# Patient Record
Sex: Female | Born: 1995 | Race: Black or African American | Hispanic: No | Marital: Single | Smoking: Former smoker
Health system: Southern US, Community
[De-identification: ages and names within clinical notes are randomized; demographics above are authoritative.]

## PROBLEM LIST (undated history)

## (undated) ENCOUNTER — Inpatient Hospital Stay: Payer: Self-pay

## (undated) DIAGNOSIS — J3089 Other allergic rhinitis: Secondary | ICD-10-CM

---
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
## 2016-04-15 ENCOUNTER — Ambulatory Visit
Admission: EM | Admit: 2016-04-15 | Discharge: 2016-04-15 | Disposition: A | Discharge status: Home / Self Care | Payer: Medicaid Other | Attending: Family Medicine | Admitting: Family Medicine

## 2016-04-15 DIAGNOSIS — Z7689 Persons encountering health services in other specified circumstances: Secondary | ICD-10-CM | POA: Diagnosis not present

## 2016-04-15 HISTORY — DX: Other allergic rhinitis: J30.89
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
## 2016-12-26 ENCOUNTER — Encounter: Payer: Self-pay | Admitting: Emergency Medicine

## 2016-12-26 ENCOUNTER — Emergency Department
Admission: EM | Admit: 2016-12-26 | Discharge: 2016-12-26 | Disposition: A | Discharge status: Home / Self Care | Payer: Medicaid Other | Attending: Emergency Medicine | Admitting: Emergency Medicine

## 2016-12-26 ENCOUNTER — Emergency Department: Payer: Medicaid Other

## 2016-12-26 DIAGNOSIS — R102 Pelvic and perineal pain: Secondary | ICD-10-CM | POA: Diagnosis not present

## 2016-12-26 DIAGNOSIS — F17210 Nicotine dependence, cigarettes, uncomplicated: Secondary | ICD-10-CM | POA: Diagnosis not present

## 2016-12-26 DIAGNOSIS — Z79899 Other long term (current) drug therapy: Secondary | ICD-10-CM | POA: Insufficient documentation

## 2016-12-26 DIAGNOSIS — N83201 Unspecified ovarian cyst, right side: Secondary | ICD-10-CM | POA: Diagnosis not present

## 2016-12-26 DIAGNOSIS — R109 Unspecified abdominal pain: Secondary | ICD-10-CM | POA: Diagnosis present

## 2016-12-26 LAB — URINALYSIS, COMPLETE (UACMP) WITH MICROSCOPIC
BILIRUBIN URINE: NEGATIVE
Bacteria, UA: NONE SEEN
Glucose, UA: NEGATIVE mg/dL
HGB URINE DIPSTICK: NEGATIVE
Ketones, ur: NEGATIVE mg/dL
Nitrite: NEGATIVE
PH: 6 (ref 5.0–8.0)
Protein, ur: 30 mg/dL — AB
RBC / HPF: NONE SEEN RBC/hpf (ref 0–5)
Specific Gravity, Urine: 1.015 (ref 1.005–1.030)

## 2016-12-26 LAB — CHLAMYDIA/NGC RT PCR (ARMC ONLY)
CHLAMYDIA TR: DETECTED — AB
N gonorrhoeae: NOT DETECTED

## 2016-12-26 LAB — WET PREP, GENITAL
CLUE CELLS WET PREP: NONE SEEN
Sperm: NONE SEEN
TRICH WET PREP: NONE SEEN
Yeast Wet Prep HPF POC: NONE SEEN

## 2016-12-26 LAB — POCT PREGNANCY, URINE: Preg Test, Ur: NEGATIVE

## 2016-12-26 MED ORDER — AZITHROMYCIN 500 MG PO TABS: 1000.0000 mg | ORAL_TABLET | Freq: Once | ORAL | Status: DC

## 2016-12-26 MED ORDER — ONDANSETRON 4 MG PO TBDP: 4.0000 mg | ORAL_TABLET | Freq: Once | ORAL | Status: DC

## 2016-12-26 MED ORDER — CEFTRIAXONE SODIUM 250 MG IJ SOLR: 250.0000 mg | Freq: Once | INTRAMUSCULAR | Status: DC

## 2016-12-26 NOTE — ED Triage Notes (Signed)
Pt presents with abd pain for two weeks.

## 2016-12-26 NOTE — ED Notes (Signed)
Patient here for lower abdominal pain x 2 weeks. Patient texting during assessment. Denies V/D. C/O nausea.

## 2016-12-26 NOTE — ED Notes (Signed)
Patient transported to US 

## 2016-12-26 NOTE — ED Provider Notes (Addendum)
[facility] Emergency Department Provider Note  ____________________________________________   First MD Initiated Contact with Patient 12/26/16 [phone]     (approximate)  I have reviewed the triage vital signs and the nursing notes.   HISTORY  Chief Complaint Abdominal Pain    HPI Molly Schmidt is a 21 y.o. female who self presents to the emergency department with 2 weeks of lower abdominal and pelvic pain. Pain is aching cramping and "uncomfortable". She has not had pain every day. Her last menstrual period was roughly 1 month ago. She has had sex since the pain started and it does not make it any better or worse. She's had no fevers or chills. No flank pain. No history of abdominal surgeries. She denies vaginal discharge or foul odor. The pain seems to be somewhat worse postprandially but nothing in particular makes it better. It is not twisting cramping aching or colicky.   [phone] Pink   Past Medical History:  Diagnosis Date  . Environmental and seasonal allergies     There are no active problems to display for this patient.   History reviewed. No pertinent surgical history.  Prior to Admission medications   Medication Sig Start Date End Date Taking? Authorizing Provider  etonogestrel (NEXPLANON) 68 MG IMPL implant 1 each by Subdermal route once.   Yes [provider]  ibuprofen (ADVIL,MOTRIN) 600 MG tablet Take 1 tablet (600 mg total) by mouth every 8 (eight) hours as needed. Patient not taking: Reported on 12/26/2016 10/14/15   Joni Reining, PA-C  ondansetron (ZOFRAN ODT) 4 MG disintegrating tablet Take 1 tablet (4 mg total) by mouth every 6 (six) hours as needed for nausea or vomiting. Patient not taking: Reported on 12/26/2016 10/05/15   Menshew, Charlesetta Ivory, PA-C  ranitidine (ZANTAC) 150 MG capsule Take 1 capsule (150 mg total) by mouth 2 (two) times daily. Patient not taking: Reported on 12/26/2016 03/20/15   Sharman Cheek,  MD  sucralfate (CARAFATE) 1 G tablet Take 1 tablet (1 g total) by mouth 4 (four) times daily. Patient not taking: Reported on 12/26/2016 03/20/15   Sharman Cheek, MD  traMADol (ULTRAM) 50 MG tablet Take 1 tablet (50 mg total) by mouth every 6 (six) hours as needed for moderate pain. Patient not taking: Reported on 12/26/2016 10/14/15   Joni Reining, PA-C    Allergies Patient has no known allergies.  No family history on file.  Social History Social History  Substance Use Topics  . Smoking status: Current Every Day Smoker    Packs/day: 0.50    Types: Cigarettes  . Smokeless tobacco: Never Used  . Alcohol use No    Review of Systems Constitutional: No fever/chills Eyes: No visual changes. ENT: No sore throat. Cardiovascular: Denies chest pain. Respiratory: Denies shortness of breath. Gastrointestinal: Positive abdominal pain.  No nausea, no vomiting.  No diarrhea.  No constipation. Genitourinary: Negative for dysuria. Musculoskeletal: Negative for back pain. Skin: Negative for rash. Neurological: Negative for headaches, focal weakness or numbness.   ____________________________________________   PHYSICAL EXAM:  VITAL SIGNS: ED Triage Vitals  Enc Vitals Group     BP 12/26/16 0902 120/78     Pulse Rate 12/26/16 0902 (!) 121     Resp 12/26/16 0902 12     Temp 12/26/16 0902 99.2 F (37.3 C)     Temp Source 12/26/16 0902 Oral     SpO2 12/26/16 0902 98 %     Weight 12/26/16 0900 120 lb (54.4  kg)     Height 12/26/16 0902 5\' 5"  (1.651 m)     Head Circumference --      Peak Flow --      Pain Score 12/26/16 0922 6     Pain Loc --      Pain Edu? --      Excl. in GC? --     Constitutional: Alert and oriented 4 pleasant cooperative speaks in full clear sentences no diaphoresis Eyes: PERRL EOMI. Head: Atraumatic. Nose: No congestion/rhinnorhea. Mouth/Throat: No trismus Neck: No stridor.   Cardiovascular: Normal rate, regular rhythm. Grossly normal heart sounds.   Good peripheral circulation. Respiratory: Normal respiratory effort.  No retractions. Lungs CTAB and moving good air Gastrointestinal: Soft nondistended mild lower abdominal tenderness although no McBurney's tenderness negative Rovsing's no costovertebral tenderness Genitourinary exam: Chaperoned by female nurse,: Normal external exam copious amount of mucopurulent discharge coming from the cervical os with cervical motion tenderness and uterine tenderness Musculoskeletal: No lower extremity edema   Neurologic:  Normal speech and language. No gross focal neurologic deficits are appreciated. Skin:  Skin is warm, dry and intact. No rash noted. Psychiatric: Mood and affect are normal. Speech and behavior are normal.    ____________________________________________   DIFFERENTIAL includes but not limited to  Appendicitis, diverticulitis, pelvic inflammatory disease, gonorrhea, chlamydia, cervicitis, ovarian torsion ____________________________________________   LABS (all labs ordered are listed, but only abnormal results are displayed)  Labs Reviewed  WET PREP, GENITAL - Abnormal; Notable for the following:       Result Value   WBC, Wet Prep HPF POC MANY (*)    All other components within normal limits  URINALYSIS, COMPLETE (UACMP) WITH MICROSCOPIC - Abnormal; Notable for the following:    Color, Urine YELLOW (*)    APPearance CLOUDY (*)    Protein, ur 30 (*)    Leukocytes, UA MODERATE (*)    Squamous Epithelial / LPF 6-30 (*)    All other components within normal limits  CHLAMYDIA/NGC RT PCR ([facility] ONLY)  POC URINE PREG, ED  POCT PREGNANCY, URINE    Not pregnant no signs of UTI. Large amount of white blood cells in the wet prep concerning for cervicitis __________________________________________  EKG   ____________________________________________  RADIOLOGY  Pelvic ultrasound with no evidence of TOA. Dominant follicle on the right good flow to both  ovaries ____________________________________________   PROCEDURES  Procedure(s) performed: no  Procedures  Critical Care performed: no  Observation: no ____________________________________________   INITIAL IMPRESSION / ASSESSMENT AND PLAN / ED COURSE  Pertinent labs & imaging results that were available during my care of the patient were reviewed by me and considered in my medical decision making (see chart for details).  The patient arrives well-appearing with 2 weeks of lower abdominal and pelvic discomfort. Doubt intra-abdominal pathology and doubt appendicitis this time. Pelvic exam is pending.     ----------------------------------------- 10:41 AM on 12/26/2016 -----------------------------------------  I recommended to the patient that she not wait for the results of her GC CT she be treated now. She declined stating she would prefer to know a definite diagnosis before receiving treatment. I explained to her that it would not be back prior to her discharge and I will call her back with the results. I appreciate that she has a dominant follicle on the right but is less than 5 cm and her history is not consistent with ovarian torsion. ____________________________________________  ----------------------------------------- 12:05 PM on 12/26/2016 -----------------------------------------  The patient's results just came back positive  for Chlamydia. I have called her 6 times but no answer. Her voice mailbox is full.  FINAL CLINICAL IMPRESSION(S) / ED DIAGNOSES  Final diagnoses:  Pelvic pain  Cyst of right ovary      NEW MEDICATIONS STARTED DURING THIS VISIT:  New Prescriptions   No medications on file     Note:  This document was prepared using Dragon voice recognition software and may include unintentional dictation errors.     Merrily Brittle, MD 12/26/16 1102    Merrily Brittle, MD 12/26/16 1205

## 2016-12-26 NOTE — Discharge Instructions (Addendum)
Please make an appointment to establish care with [facility] within the next week for reevaluation. Return to the emergency department sooner for any new or worsening symptoms such as fevers, chills, if you cannot eat or drink, worsening pain, or for any other concerns whatsoever.  It was a pleasure to take care of you today, and thank you for coming to our emergency department.  If you have any questions or concerns before leaving please ask the nurse to grab me and I'm more than happy to go through your aftercare instructions again.  If you were prescribed any opioid pain medication today such as Norco, Vicodin, Percocet, morphine, hydrocodone, or oxycodone please make sure you do not drive when you are taking this medication as it can alter your ability to drive safely.  If you have any concerns once you are home that you are not improving or are in fact getting worse before you can make it to your follow-up appointment, please do not hesitate to call 911 and come back for further evaluation.  Merrily BrittleNeil Nely Dedmon, MD  Results for orders placed or performed during the hospital encounter of 12/26/16  Wet prep, genital  Result Value Ref Range   Yeast Wet Prep HPF POC NONE SEEN NONE SEEN   Trich, Wet Prep NONE SEEN NONE SEEN   Clue Cells Wet Prep HPF POC NONE SEEN NONE SEEN   WBC, Wet Prep HPF POC MANY (A) NONE SEEN   Sperm NONE SEEN   Urinalysis, Complete w Microscopic  Result Value Ref Range   Color, Urine YELLOW (A) YELLOW   APPearance CLOUDY (A) CLEAR   Specific Gravity, Urine 1.015 1.005 - 1.030   pH 6.0 5.0 - 8.0   Glucose, UA NEGATIVE NEGATIVE mg/dL   Hgb urine dipstick NEGATIVE NEGATIVE   Bilirubin Urine NEGATIVE NEGATIVE   Ketones, ur NEGATIVE NEGATIVE mg/dL   Protein, ur 30 (A) NEGATIVE mg/dL   Nitrite NEGATIVE NEGATIVE   Leukocytes, UA MODERATE (A) NEGATIVE   RBC / HPF NONE SEEN 0 - 5 RBC/hpf   WBC, UA 6-30 0 - 5 WBC/hpf   Bacteria, UA NONE SEEN NONE SEEN   Squamous  Epithelial / LPF 6-30 (A) NONE SEEN   Mucous PRESENT    Hyaline Casts, UA PRESENT   Pregnancy, urine POC  Result Value Ref Range   Preg Test, Ur NEGATIVE NEGATIVE   [place] Transvaginal Non-ob  Result Date: 12/26/2016 CLINICAL DATA:  Generalized pelvic pain, nausea, vomiting for 2 weeks EXAM: TRANSABDOMINAL AND TRANSVAGINAL ULTRASOUND OF PELVIS TECHNIQUE: Both transabdominal and transvaginal ultrasound examinations of the pelvis were performed. Transabdominal technique was performed for global imaging of the pelvis including uterus, ovaries, adnexal regions, and pelvic cul-de-sac. It was necessary to proceed with endovaginal exam following the transabdominal exam to visualize the uterus, endometrium, ovaries and adnexa . COMPARISON:  None FINDINGS: Uterus Measurements: 7.4 x 4.3 x 4.7 cm. No fibroids or other mass visualized. Endometrium Thickness: 7 mm in thickness.  No focal abnormality visualized. Right ovary Measurements: 4.1 x 3.4 x 3.2 cm. Dominant follicle or small cyst in the right ovary measures 2.8 cm in maximum diameter. Left ovary Measurements: 2.9 x 1.1 x 2.1 cm. Normal appearance/no adnexal mass. Other findings No abnormal free fluid. IMPRESSION: No acute or significant abnormality. Electronically Signed   By: Charlett NoseKevin  Dover M.D.   On: 12/26/2016 10:48   [place] Pelvis Complete  Result Date: 12/26/2016 CLINICAL DATA:  Generalized pelvic pain, nausea, vomiting for 2 weeks EXAM: TRANSABDOMINAL AND  TRANSVAGINAL ULTRASOUND OF PELVIS TECHNIQUE: Both transabdominal and transvaginal ultrasound examinations of the pelvis were performed. Transabdominal technique was performed for global imaging of the pelvis including uterus, ovaries, adnexal regions, and pelvic cul-de-sac. It was necessary to proceed with endovaginal exam following the transabdominal exam to visualize the uterus, endometrium, ovaries and adnexa . COMPARISON:  None FINDINGS: Uterus Measurements: 7.4 x 4.3 x 4.7 cm. No fibroids or other mass  visualized. Endometrium Thickness: 7 mm in thickness.  No focal abnormality visualized. Right ovary Measurements: 4.1 x 3.4 x 3.2 cm. Dominant follicle or small cyst in the right ovary measures 2.8 cm in maximum diameter. Left ovary Measurements: 2.9 x 1.1 x 2.1 cm. Normal appearance/no adnexal mass. Other findings No abnormal free fluid. IMPRESSION: No acute or significant abnormality. Electronically Signed   By: Charlett Nose M.D.   On: 12/26/2016 10:48   [place] Art/ven Flow Abd Pelv Doppler  Result Date: 12/26/2016 CLINICAL DATA:  Generalized pelvic pain, nausea, vomiting for 2 weeks EXAM: TRANSABDOMINAL AND TRANSVAGINAL ULTRASOUND OF PELVIS TECHNIQUE: Both transabdominal and transvaginal ultrasound examinations of the pelvis were performed. Transabdominal technique was performed for global imaging of the pelvis including uterus, ovaries, adnexal regions, and pelvic cul-de-sac. It was necessary to proceed with endovaginal exam following the transabdominal exam to visualize the uterus, endometrium, ovaries and adnexa . COMPARISON:  None FINDINGS: Uterus Measurements: 7.4 x 4.3 x 4.7 cm. No fibroids or other mass visualized. Endometrium Thickness: 7 mm in thickness.  No focal abnormality visualized. Right ovary Measurements: 4.1 x 3.4 x 3.2 cm. Dominant follicle or small cyst in the right ovary measures 2.8 cm in maximum diameter. Left ovary Measurements: 2.9 x 1.1 x 2.1 cm. Normal appearance/no adnexal mass. Other findings No abnormal free fluid. IMPRESSION: No acute or significant abnormality. Electronically Signed   By: Charlett Nose M.D.   On: 12/26/2016 10:48

## 2017-01-02 ENCOUNTER — Emergency Department
Admission: EM | Admit: 2017-01-02 | Discharge: 2017-01-02 | Disposition: A | Discharge status: Home / Self Care | Payer: Medicaid Other | Attending: Emergency Medicine | Admitting: Emergency Medicine

## 2017-01-02 DIAGNOSIS — Z79899 Other long term (current) drug therapy: Secondary | ICD-10-CM | POA: Diagnosis not present

## 2017-01-02 DIAGNOSIS — A749 Chlamydial infection, unspecified: Secondary | ICD-10-CM | POA: Insufficient documentation

## 2017-01-02 DIAGNOSIS — N939 Abnormal uterine and vaginal bleeding, unspecified: Secondary | ICD-10-CM | POA: Diagnosis present

## 2017-01-02 DIAGNOSIS — F17210 Nicotine dependence, cigarettes, uncomplicated: Secondary | ICD-10-CM | POA: Insufficient documentation

## 2017-01-02 LAB — URINALYSIS, COMPLETE (UACMP) WITH MICROSCOPIC
BACTERIA UA: NONE SEEN
BILIRUBIN URINE: NEGATIVE
Glucose, UA: NEGATIVE mg/dL
KETONES UR: NEGATIVE mg/dL
LEUKOCYTES UA: NEGATIVE
NITRITE: NEGATIVE
PROTEIN: NEGATIVE mg/dL
SPECIFIC GRAVITY, URINE: 1.016 (ref 1.005–1.030)
pH: 6 (ref 5.0–8.0)

## 2017-01-02 LAB — HCG, QUANTITATIVE, PREGNANCY: hCG, Beta Chain, Quant, S: <1 m[IU]/mL (ref <5)

## 2017-01-02 LAB — POCT PREGNANCY, URINE: PREG TEST UR: NEGATIVE

## 2017-01-02 MED ORDER — CEFTRIAXONE SODIUM 250 MG IJ SOLR
250.0000 mg | Freq: Once | INTRAMUSCULAR | Status: AC
  Administered 2017-01-02: 250 mg via INTRAMUSCULAR
  Filled 2017-01-02: qty 250

## 2017-01-02 MED ORDER — AZITHROMYCIN 500 MG PO TABS
1000.0000 mg | ORAL_TABLET | Freq: Once | ORAL | Status: AC
  Administered 2017-01-02: 1000 mg via ORAL
  Filled 2017-01-02: qty 2

## 2017-01-02 NOTE — ED Notes (Addendum)
Spoke with Marisue Ivan, states she attempted to contact the pt via phone to give positive chlamydia results but was unreachable and states she sent the pt a letter on 8/16.Marland Kitchen

## 2017-01-02 NOTE — ED Notes (Signed)
Vitals taken, Labs collected and sent, UA collected, sent and POC performed. Pt escorted to flex waiting room.

## 2017-01-02 NOTE — ED Provider Notes (Signed)
[facility] Emergency Department Provider Note  ____________________________________________  Time seen: Approximately 3:23 PM  I have reviewed the triage vital signs and the nursing notes.   HISTORY  Chief Complaint Vaginal Bleeding    HPI Molly Schmidt is a 21 y.o. female that presents to the emergency department for evaluation of vaginal bleeding and positive chlamydiatest. She felt pressure to urinate this morning and noticed blood on her toilet paper. This is happened a couple times in the last 3 days. Blood is dark and black in color. She states that last menstrual period was last week. She has an appointment tomorrow with her PCP to discuss IUD. She denies nausea, vomiting, abdominal pain, back pain, dysuria, urgency, frequency.   Past Medical History:  Diagnosis Date  . Environmental and seasonal allergies     There are no active problems to display for this patient.   History reviewed. No pertinent surgical history.  Prior to Admission medications   Medication Sig Start Date End Date Taking? Authorizing Provider  etonogestrel (NEXPLANON) 68 MG IMPL implant 1 each by Subdermal route once.    [provider]  ibuprofen (ADVIL,MOTRIN) 600 MG tablet Take 1 tablet (600 mg total) by mouth every 8 (eight) hours as needed. Patient not taking: Reported on 12/26/2016 10/14/15   Joni Reining, PA-C  ondansetron (ZOFRAN ODT) 4 MG disintegrating tablet Take 1 tablet (4 mg total) by mouth every 6 (six) hours as needed for nausea or vomiting. Patient not taking: Reported on 12/26/2016 10/05/15   Menshew, Charlesetta Ivory, PA-C  ranitidine (ZANTAC) 150 MG capsule Take 1 capsule (150 mg total) by mouth 2 (two) times daily. Patient not taking: Reported on 12/26/2016 03/20/15   Sharman Cheek, MD  sucralfate (CARAFATE) 1 G tablet Take 1 tablet (1 g total) by mouth 4 (four) times daily. Patient not taking: Reported on 12/26/2016 03/20/15   Sharman Cheek, MD   traMADol (ULTRAM) 50 MG tablet Take 1 tablet (50 mg total) by mouth every 6 (six) hours as needed for moderate pain. Patient not taking: Reported on 12/26/2016 10/14/15   Joni Reining, PA-C    Allergies Patient has no known allergies.  No family history on file.  Social History Social History  Substance Use Topics  . Smoking status: Current Every Day Smoker    Packs/day: 0.50    Types: Cigarettes  . Smokeless tobacco: Never Used  . Alcohol use No     Review of Systems  Constitutional: No fever/chills Cardiovascular: No chest pain. Respiratory: No SOB. Gastrointestinal: No abdominal pain.  No nausea, no vomiting.  Musculoskeletal: Negative for musculoskeletal pain. Skin: Negative for rash, abrasions, lacerations, ecchymosis.   ____________________________________________   PHYSICAL EXAM:  VITAL SIGNS: ED Triage Vitals [01/02/17 1406]  Enc Vitals Group     BP 126/80     Pulse Rate 92     Resp 16     Temp 98.5 F (36.9 C)     Temp Source Oral     SpO2 97 %     Weight 115 lb (52.2 kg)     Height 5\' 5"  (1.651 m)     Head Circumference      Peak Flow      Pain Score      Pain Loc      Pain Edu?      Excl. in GC?      Constitutional: Alert and oriented. Well appearing and in no acute distress. Eyes: Conjunctivae are normal.  PERRL. EOMI. Head: Atraumatic. ENT:      Ears:      Nose: No congestion/rhinnorhea.      Mouth/Throat: Mucous membranes are moist.  Neck: No stridor.   Cardiovascular: Normal rate, regular rhythm.  Good peripheral circulation. Respiratory: Normal respiratory effort without tachypnea or retractions. Lungs CTAB. Good air entry to the bases with no decreased or absent breath sounds. Gastrointestinal: Bowel sounds 4 quadrants. Soft and nontender to palpation. No guarding or rigidity. No palpable masses. No distention. No CVA tenderness. Musculoskeletal: Full range of motion to all extremities. No gross deformities appreciated. Neurologic:   Normal speech and language. No gross focal neurologic deficits are appreciated.  Skin:  Skin is warm, dry and intact. No rash noted.   ____________________________________________   LABS (all labs ordered are listed, but only abnormal results are displayed)  Labs Reviewed  URINALYSIS, COMPLETE (UACMP) WITH MICROSCOPIC - Abnormal; Notable for the following:       Result Value   Color, Urine YELLOW (*)    APPearance HAZY (*)    Hgb urine dipstick LARGE (*)    Squamous Epithelial / LPF 6-30 (*)    All other components within normal limits  HCG, QUANTITATIVE, PREGNANCY  POC URINE PREG, ED  POCT PREGNANCY, URINE   ____________________________________________  EKG   ____________________________________________  RADIOLOGY  No results found.  ____________________________________________    PROCEDURES  Procedure(s) performed:    Procedures    Medications  cefTRIAXone (ROCEPHIN) injection 250 mg (250 mg Intramuscular Given 01/02/17 1535)  azithromycin (ZITHROMAX) tablet 1,000 mg (1,000 mg Oral Given 01/02/17 1535)     ____________________________________________   INITIAL IMPRESSION / ASSESSMENT AND PLAN / ED COURSE  Pertinent labs & imaging results that were available during my care of the patient were reviewed by me and considered in my medical decision making (see chart for details).  Review of the [place] CSRS was performed in accordance of the [facility] prior to dispensing any controlled drugs.   Patient presented to the emergency department for evaluation of vaginal bleeding and a positive chlamydia test. Vital signs and exam are reassuring. Patient had a pelvic exam, wet prep, and ultrasound completed in the ED last week. She denies any pain. Patient told me her last menstrual period was last week but she was also evaluated in the emergency department last week and told the provider that her last menstrual period was over one month ago. Patient will be treated for  gonorrhea and chlamydia with ceftriaxone and azithromycin. She has an appointment with PCP tomorrow.  Patient is to follow up with health department as directed. Patient is given ED precautions to return to the ED for any worsening or new symptoms.     ____________________________________________  FINAL CLINICAL IMPRESSION(S) / ED DIAGNOSES  Final diagnoses:  Chlamydia      NEW MEDICATIONS STARTED DURING THIS VISIT:  New Prescriptions   No medications on file        This chart was dictated using voice recognition software/Dragon. Despite best efforts to proofread, errors can occur which can change the meaning. Any change was purely unintentional.    Enid Derry, PA-C 01/02/17 1723    Sharman Cheek, MD 01/04/17 0001

## 2017-01-02 NOTE — ED Notes (Signed)
See triage note  States she developed some vaginal bleeding over the past 3 days    Also states she is here for STD treatment

## 2017-01-02 NOTE — ED Triage Notes (Signed)
Pt c/o black colored blood/vaginal bleeding for the past 3 days, states last period was 2 months ago. Denies any pain. States she does having some pressure with urination.Molly Schmidt

## 2017-03-26 ENCOUNTER — Other Ambulatory Visit: Payer: Self-pay

## 2017-03-26 ENCOUNTER — Encounter: Payer: Self-pay | Admitting: Emergency Medicine

## 2017-03-26 ENCOUNTER — Emergency Department
Admission: EM | Admit: 2017-03-26 | Discharge: 2017-03-26 | Disposition: A | Discharge status: Home / Self Care | Payer: Medicaid Other | Attending: Emergency Medicine | Admitting: Emergency Medicine

## 2017-03-26 DIAGNOSIS — Y999 Unspecified external cause status: Secondary | ICD-10-CM | POA: Insufficient documentation

## 2017-03-26 DIAGNOSIS — Y9384 Activity, sleeping: Secondary | ICD-10-CM | POA: Diagnosis not present

## 2017-03-26 DIAGNOSIS — G44319 Acute post-traumatic headache, not intractable: Secondary | ICD-10-CM

## 2017-03-26 DIAGNOSIS — F17210 Nicotine dependence, cigarettes, uncomplicated: Secondary | ICD-10-CM | POA: Insufficient documentation

## 2017-03-26 DIAGNOSIS — Y92003 Bedroom of unspecified non-institutional (private) residence as the place of occurrence of the external cause: Secondary | ICD-10-CM | POA: Diagnosis not present

## 2017-03-26 DIAGNOSIS — S0990XA Unspecified injury of head, initial encounter: Secondary | ICD-10-CM | POA: Insufficient documentation

## 2017-03-26 DIAGNOSIS — W06XXXA Fall from bed, initial encounter: Secondary | ICD-10-CM | POA: Diagnosis not present

## 2017-03-26 MED ORDER — ASPIRIN-ACETAMINOPHEN-CAFFEINE 250-250-65 MG PO TABS: 1.0000 | ORAL_TABLET | Freq: Four times a day (QID) | ORAL | 0 refills | Status: DC | PRN

## 2017-03-26 NOTE — ED Triage Notes (Signed)
Patient reports that she fell off of a bed last night and hit her head. Patient denies LOC. Patient states that she continues to have pain.

## 2017-03-26 NOTE — ED Provider Notes (Signed)
[facility] Emergency Department Provider Note  ____________________________________________  Time seen: Approximately 8:55 PM  I have reviewed the triage vital signs and the nursing notes.   HISTORY  Chief Complaint Head Injury    HPI Molly Schmidt is a 21 y.o. female who presents the emergency department complaining of headache and dizziness status post head injury.  Patient reports that she fell out of the bed and hit her head on the carpeted floor last night.  Patient had no symptoms last night.  This morning she woke up and felt fine.  Today she developed a mild right-sided headache over her the area she had on the floor.  Patient had an episode where she felt dizzy but immediately resolved when she sat down.  Patient tried Tylenol for complaint but states that symptoms did not improve.  Patient denies any headache, visual changes, neck pain, chest pain, shortness of breath at this time.  No other complaints.  No other medications prior to arrival.  Past Medical History:  Diagnosis Date  . Environmental and seasonal allergies     There are no active problems to display for this patient.   History reviewed. No pertinent surgical history.  Prior to Admission medications   Medication Sig Start Date End Date Taking? Authorizing Provider  aspirin-acetaminophen-caffeine (EXCEDRIN MIGRAINE) [id number] MG tablet Take 1 tablet every 6 (six) hours as needed by mouth for headache. 03/26/17   Cuthriell, Delorise RoyalsJonathan D, PA-C  etonogestrel (NEXPLANON) 68 MG IMPL implant 1 each by Subdermal route once.    [provider]  ibuprofen (ADVIL,MOTRIN) 600 MG tablet Take 1 tablet (600 mg total) by mouth every 8 (eight) hours as needed. Patient not taking: Reported on 12/26/2016 10/14/15   Joni ReiningSmith, Ronald K, PA-C  ondansetron (ZOFRAN ODT) 4 MG disintegrating tablet Take 1 tablet (4 mg total) by mouth every 6 (six) hours as needed for nausea or vomiting. Patient not taking:  Reported on 12/26/2016 10/05/15   Menshew, Charlesetta IvoryJenise V Bacon, PA-C  ranitidine (ZANTAC) 150 MG capsule Take 1 capsule (150 mg total) by mouth 2 (two) times daily. Patient not taking: Reported on 12/26/2016 03/20/15   Sharman CheekStafford, Phillip, MD  sucralfate (CARAFATE) 1 G tablet Take 1 tablet (1 g total) by mouth 4 (four) times daily. Patient not taking: Reported on 12/26/2016 03/20/15   Sharman CheekStafford, Phillip, MD  traMADol (ULTRAM) 50 MG tablet Take 1 tablet (50 mg total) by mouth every 6 (six) hours as needed for moderate pain. Patient not taking: Reported on 12/26/2016 10/14/15   Joni ReiningSmith, Ronald K, PA-C    Allergies Patient has no known allergies.  No family history on file.  Social History Social History   Tobacco Use  . Smoking status: Current Every Day Smoker    Packs/day: 0.50    Types: Cigarettes  . Smokeless tobacco: Never Used  Substance Use Topics  . Alcohol use: No  . Drug use: No     Review of Systems  Constitutional: No fever/chills Eyes: No visual changes. Cardiovascular: no chest pain. Respiratory: no cough. No SOB. Gastrointestinal: No abdominal pain.  No nausea, no vomiting.   Musculoskeletal: Negative for musculoskeletal pain. Skin: Negative for rash, abrasions, lacerations, ecchymosis. Neurological: Positive for headache and dizziness earlier, but denies any focal weakness or numbness. 10-point ROS otherwise negative.  ____________________________________________   PHYSICAL EXAM:  VITAL SIGNS: ED Triage Vitals  Enc Vitals Group     BP 03/26/17 2041 131/73     Pulse Rate 03/26/17 2041 83  Resp 03/26/17 2041 18     Temp 03/26/17 2041 97.9 F (36.6 C)     Temp Source 03/26/17 2041 Oral     SpO2 03/26/17 2041 100 %     Weight 03/26/17 2042 115 lb (52.2 kg)     Height 03/26/17 2042 5\' 5"  (1.651 m)     Head Circumference --      Peak Flow --      Pain Score --      Pain Loc --      Pain Edu? --      Excl. in GC? --      Constitutional: Alert and oriented. Well  appearing and in no acute distress. Eyes: Conjunctivae are normal. PERRL. EOMI. Head: Atraumatic.  No visible signs of trauma.  Patient is mildly tender to palpation over the right temporal area with no palpable abnormality or crepitus.  No tenderness to palpation.  No battle signs, raccoon eyes, serosanguineous fluid drainage from the ears or nares. ENT:      Ears:       Nose: No congestion/rhinnorhea.      Mouth/Throat: Mucous membranes are moist.  Neck: No stridor.  No cervical spine tenderness to palpation.  Cardiovascular: Normal rate, regular rhythm. Normal S1 and S2.  Good peripheral circulation. Respiratory: Normal respiratory effort without tachypnea or retractions. Lungs CTAB. Good air entry to the bases with no decreased or absent breath sounds. Musculoskeletal: Full range of motion to all extremities. No gross deformities appreciated. Neurologic:  Normal speech and language. No gross focal neurologic deficits are appreciated.  Cranial nerves II through XII grossly intact.  Negative Romberg's and pronator drift. Skin:  Skin is warm, dry and intact. No rash noted. Psychiatric: Mood and affect are normal. Speech and behavior are normal. Patient exhibits appropriate insight and judgement.   ____________________________________________   LABS (all labs ordered are listed, but only abnormal results are displayed)  Labs Reviewed - No data to display ____________________________________________  EKG   ____________________________________________  RADIOLOGY   No results found.  ____________________________________________    PROCEDURES  Procedure(s) performed:    Procedures    [place] CT Head Rule   CT head is recommended if yes to ANY of the following:   Major Criteria ("high risk" for an injury requiring neurosurgical intervention, sensitivity 100%):   No.   GCS < 15 at 2 hours post-injury No.   Suspected open or depressed skull fracture No.   Any sign of  basilar skull fracture? (Hemotympanum, racoon eyes, battle's sign, CSF oto/rhinorrhea) No.   ? 2 episodes of vomiting No.   Age ? 65   Minor Criteria ("medium" risk for an intracranial traumatic finding, sensitivity 83-100%):   No.   Retrograde Amnesia to the Event ? 30 minutes No.   "Dangerous" Mechanism? (Pedestrian struck by motor vehicle, occupant ejected from motor vehicle, fall from >3 ft or >5 stairs.)   Based on my evaluation of the patient, including application of this decision instrument, CT head to evaluate for traumatic intracranial injury is not indicated at this time. I have discussed this recommendation with the patient who states understanding and agreement with this plan.  NEXUS C-spine Criteria   C-spine imaging is recommended if yes to ANY of the following (Mneumonic is "NSAID"):   No.  N - neurologic (focal) deficit present No.   S - spinal midline tenderness present No.  A - altered level of consciousness present No.    I  - intoxication present No.  D - distracting injury present   Based on my evaluation of the patient, including application of this decision instrument, cervical spine imaging to evaluate for injury is not indicated at this time. I have discussed this recommendation with the patient who states understanding and agreement with this plan.   Medications - No data to display   ____________________________________________   INITIAL IMPRESSION / ASSESSMENT AND PLAN / ED COURSE  Pertinent labs & imaging results that were available during my care of the patient were reviewed by me and considered in my medical decision making (see chart for details).  Review of the [place] CSRS was performed in accordance of the [facility] prior to dispensing any controlled drugs.     Patient's diagnosis is consistent with fall, headache.  Patient fell last night and hit her head clinic carpeted floor.  This occurred for approximately 2 feet in the air.  No symptoms  immediately.  Today patient had a mild headache with an episode of dizziness.  This has resolved at this time.  Exam is reassuring with no focal neurological deficits.  Patient does not meet criteria for scans as she is negative on both the [place] head CT rule and Nexus C-spine criteria.  Patient is offered migraine cocktail injections in the emergency department and declines.. Patient will be discharged home with prescriptions for excedrin for headache. Patient is to follow up with primary care as needed or otherwise directed. Patient is given ED precautions to return to the ED for any worsening or new symptoms.     ____________________________________________  FINAL CLINICAL IMPRESSION(S) / ED DIAGNOSES  Final diagnoses:  Minor head injury, initial encounter  Acute post-traumatic headache, not intractable      NEW MEDICATIONS STARTED DURING THIS VISIT:  ED Discharge Orders        Ordered    aspirin-acetaminophen-caffeine (EXCEDRIN MIGRAINE) 250-250-65 MG tablet  Every 6 hours PRN     03/26/17 2105          This chart was dictated using voice recognition software/Dragon. Despite best efforts to proofread, errors can occur which can change the meaning. Any change was purely unintentional.    Racheal Patches, PA-C 03/26/17 2106    Phineas Semen, MD 03/26/17 2108

## 2017-11-23 ENCOUNTER — Other Ambulatory Visit: Payer: Self-pay

## 2017-11-23 ENCOUNTER — Encounter: Payer: Self-pay | Admitting: Emergency Medicine

## 2017-11-23 ENCOUNTER — Emergency Department
Admission: EM | Admit: 2017-11-23 | Discharge: 2017-11-23 | Disposition: A | Discharge status: Home / Self Care | Payer: Medicaid Other | Attending: Emergency Medicine | Admitting: Emergency Medicine

## 2017-11-23 DIAGNOSIS — F17210 Nicotine dependence, cigarettes, uncomplicated: Secondary | ICD-10-CM | POA: Diagnosis not present

## 2017-11-23 DIAGNOSIS — O219 Vomiting of pregnancy, unspecified: Secondary | ICD-10-CM | POA: Diagnosis present

## 2017-11-23 DIAGNOSIS — O99331 Smoking (tobacco) complicating pregnancy, first trimester: Secondary | ICD-10-CM | POA: Diagnosis not present

## 2017-11-23 DIAGNOSIS — Z3491 Encounter for supervision of normal pregnancy, unspecified, first trimester: Secondary | ICD-10-CM

## 2017-11-23 DIAGNOSIS — Z3A00 Weeks of gestation of pregnancy not specified: Secondary | ICD-10-CM | POA: Insufficient documentation

## 2017-11-23 LAB — URINALYSIS, COMPLETE (UACMP) WITH MICROSCOPIC
Bacteria, UA: NONE SEEN
Bilirubin Urine: NEGATIVE
GLUCOSE, UA: NEGATIVE mg/dL
Hgb urine dipstick: NEGATIVE
KETONES UR: 20 mg/dL — AB
Nitrite: NEGATIVE
PROTEIN: NEGATIVE mg/dL
Specific Gravity, Urine: 1.012 (ref 1.005–1.030)
pH: 6 (ref 5.0–8.0)

## 2017-11-23 LAB — CBC
HEMATOCRIT: 35.6 % (ref 35.0–47.0)
HEMOGLOBIN: 12.4 g/dL (ref 12.0–16.0)
MCH: 32.1 pg (ref 26.0–34.0)
MCHC: 34.7 g/dL (ref 32.0–36.0)
MCV: 92.7 fL (ref 80.0–100.0)
Platelets: 200 10*3/uL (ref 150–440)
RBC: 3.84 MIL/uL (ref 3.80–5.20)
RDW: 14 % (ref 11.5–14.5)
WBC: 10 10*3/uL (ref 3.6–11.0)

## 2017-11-23 LAB — LIPASE, BLOOD: Lipase: 29 U/L (ref 11–51)

## 2017-11-23 LAB — COMPREHENSIVE METABOLIC PANEL
ALBUMIN: 4.4 g/dL (ref 3.5–5.0)
ALK PHOS: 44 U/L (ref 38–126)
ALT: 13 U/L (ref 0–44)
ANION GAP: 8 (ref 5–15)
AST: 17 U/L (ref 15–41)
BUN: 6 mg/dL (ref 6–20)
CALCIUM: 9.6 mg/dL (ref 8.9–10.3)
CO2: 23 mmol/L (ref 22–32)
CREATININE: 0.48 mg/dL (ref 0.44–1.00)
Chloride: 104 mmol/L (ref 98–111)
GFR calc Af Amer: >60 mL/min (ref >60)
GFR calc non Af Amer: >60 mL/min (ref >60)
GLUCOSE: 93 mg/dL (ref 70–99)
Potassium: 3.4 mmol/L — ABNORMAL LOW (ref 3.5–5.1)
SODIUM: 135 mmol/L (ref 135–145)
Total Bilirubin: 1.1 mg/dL (ref 0.3–1.2)
Total Protein: 7.8 g/dL (ref 6.5–8.1)

## 2017-11-23 LAB — POCT PREGNANCY, URINE: Preg Test, Ur: POSITIVE — AB

## 2017-11-23 MED ORDER — ONDANSETRON 4 MG PO TBDP
4.0000 mg | ORAL_TABLET | Freq: Once | ORAL | Status: AC
  Administered 2017-11-23: 4 mg via ORAL

## 2017-11-23 MED ORDER — ONDANSETRON 4 MG PO TBDP
ORAL_TABLET | ORAL | Status: AC
  Filled 2017-11-23: qty 1

## 2017-11-23 NOTE — ED Provider Notes (Signed)
[facility] Emergency Department Provider Note  ____________________________________________  Time seen: Approximately 9:14 PM  I have reviewed the triage vital signs and the nursing notes.   HISTORY  Chief Complaint Emesis During Pregnancy    HPI Molly Schmidt is a 22 y.o. female resents to the emergency department for treatment and evaluation of intermittent nausea and vomiting for the past week.  She states that after she eats she feels very sick to her stomach and occasionally will vomit.  She has some occasional diffuse abdominal cramping.  She has tried drinking some ginger ale without any improvement.  Last episode of vomiting was this afternoon.   Past Medical History:  Diagnosis Date  . Environmental and seasonal allergies     There are no active problems to display for this patient.   History reviewed. No pertinent surgical history.  Prior to Admission medications   Medication Sig Start Date End Date Taking? Authorizing Provider  aspirin-acetaminophen-caffeine (EXCEDRIN MIGRAINE) [phone] MG tablet Take 1 tablet every 6 (six) hours as needed by mouth for headache. 03/26/17   Cuthriell, Delorise RoyalsJonathan D, PA-C  etonogestrel (NEXPLANON) 68 MG IMPL implant 1 each by Subdermal route once.    [provider]  ibuprofen (ADVIL,MOTRIN) 600 MG tablet Take 1 tablet (600 mg total) by mouth every 8 (eight) hours as needed. Patient not taking: Reported on 12/26/2016 10/14/15   Joni ReiningSmith, Ronald K, PA-C  ondansetron (ZOFRAN ODT) 4 MG disintegrating tablet Take 1 tablet (4 mg total) by mouth every 6 (six) hours as needed for nausea or vomiting. Patient not taking: Reported on 12/26/2016 10/05/15   Menshew, Charlesetta IvoryJenise V Bacon, PA-C  ranitidine (ZANTAC) 150 MG capsule Take 1 capsule (150 mg total) by mouth 2 (two) times daily. Patient not taking: Reported on 12/26/2016 03/20/15   Sharman CheekStafford, Phillip, MD  sucralfate (CARAFATE) 1 G tablet Take 1 tablet (1 g total) by mouth 4  (four) times daily. Patient not taking: Reported on 12/26/2016 03/20/15   Sharman CheekStafford, Phillip, MD  traMADol (ULTRAM) 50 MG tablet Take 1 tablet (50 mg total) by mouth every 6 (six) hours as needed for moderate pain. Patient not taking: Reported on 12/26/2016 10/14/15   Joni ReiningSmith, Ronald K, PA-C    Allergies Patient has no known allergies.  No family history on file.  Social History Social History   Tobacco Use  . Smoking status: Current Every Day Smoker    Packs/day: 0.25    Types: Cigarettes  . Smokeless tobacco: Never Used  Substance Use Topics  . Alcohol use: No  . Drug use: No    Review of Systems Constitutional: Negative for fever. Respiratory: Negative for shortness of breath or cough. Gastrointestinal: Negative for abdominal pain; positive for nausea , positive for vomiting. Genitourinary: Negative for dysuria , negative for vaginal discharge. Musculoskeletal: Negative for back pain. Skin: Negative for acute skin changes/rash/lesion. ____________________________________________   PHYSICAL EXAM:  VITAL SIGNS: ED Triage Vitals  Enc Vitals Group     BP 11/23/17 1912 109/66     Pulse Rate 11/23/17 1912 94     Resp 11/23/17 1912 18     Temp 11/23/17 1912 98.6 F (37 C)     Temp Source 11/23/17 1912 Oral     SpO2 11/23/17 1912 100 %     Weight 11/23/17 1910 105 lb (47.6 kg)     Height 11/23/17 1910 5\' 3"  (1.6 m)     Head Circumference --      Peak Flow --  Pain Score 11/23/17 1910 7     Pain Loc --      Pain Edu? --      Excl. in GC? --     Constitutional: Alert and oriented. Well appearing and in no acute distress. Eyes: Conjunctivae are normal. Head: Atraumatic. Nose: No congestion/rhinnorhea. Mouth/Throat: Mucous membranes are moist. Respiratory: Normal respiratory effort.  No retractions. Gastrointestinal: Bowel sounds active x 4; Abdomen is soft without rebound or guarding. Genitourinary: Pelvic exam: Not indicated Musculoskeletal: No extremity  tenderness nor edema.  Neurologic:  Normal speech and language. No gross focal neurologic deficits are appreciated. Speech is normal. No gait instability. Skin:  Skin is warm, dry and intact. No rash noted on exposed skin. Psychiatric: Mood and affect are normal. Speech and behavior are normal.  ____________________________________________   LABS (all labs ordered are listed, but only abnormal results are displayed)  Labs Reviewed  COMPREHENSIVE METABOLIC PANEL - Abnormal; Notable for the following components:      Result Value   Potassium 3.4 (*)    All other components within normal limits  URINALYSIS, COMPLETE (UACMP) WITH MICROSCOPIC - Abnormal; Notable for the following components:   Color, Urine YELLOW (*)    APPearance HAZY (*)    Ketones, ur 20 (*)    Leukocytes, UA TRACE (*)    All other components within normal limits  POCT PREGNANCY, URINE - Abnormal; Notable for the following components:   Preg Test, Ur POSITIVE (*)    All other components within normal limits  LIPASE, BLOOD  CBC  POC URINE PREG, ED   ____________________________________________  RADIOLOGY  Not indicated ____________________________________________  Procedures  ____________________________________________  22 year old female presenting to the emergency department for treatment and evaluation of abdominal cramping, nausea, and vomiting.  Urine pregnancy test is positive.  Her Nexplanon was removed about 1 year ago.  She was using protection but states "I guess that did not work."  Patient was given these results and advised that this is the most likely cause of her nausea and vomiting.  She will be given instructions regarding expectations for her first trimester pregnancy.  She was advised that she will need to start taking prenatal vitamins but was advised to do this at night before bed to help with nausea.  She was advised to call her primary care provider tomorrow to schedule an appointment.  She  was advised to return to the emergency department for any concerns if unable to see primary care or OB/GYN.  She is a gravida 1 para 0.  INITIAL IMPRESSION / ASSESSMENT AND PLAN / ED COURSE  Pertinent labs & imaging results that were available during my care of the patient were reviewed by me and considered in my medical decision making (see chart for details).  ____________________________________________   FINAL CLINICAL IMPRESSION(S) / ED DIAGNOSES  Final diagnoses:  First trimester pregnancy  Nausea and vomiting in pregnancy    Note:  This document was prepared using Dragon voice recognition software and may include unintentional dictation errors.    Chinita Pester, FNP 11/23/17 2118    Sharyn Creamer, MD 11/24/17 [phone]

## 2017-11-23 NOTE — ED Triage Notes (Signed)
Pt arrives ambulatory with steady gait to ED with c/o emesis x 1 week. Pt reports intermittent N/V during this time which only occurs when she eats. Pt is in NAD.

## 2017-12-12 ENCOUNTER — Emergency Department
Admission: EM | Admit: 2017-12-12 | Discharge: 2017-12-12 | Disposition: A | Discharge status: Home / Self Care | Payer: Medicaid Other | Attending: Emergency Medicine | Admitting: Emergency Medicine

## 2017-12-12 ENCOUNTER — Other Ambulatory Visit: Payer: Self-pay

## 2017-12-12 DIAGNOSIS — R42 Dizziness and giddiness: Secondary | ICD-10-CM | POA: Diagnosis present

## 2017-12-12 DIAGNOSIS — Z5321 Procedure and treatment not carried out due to patient leaving prior to being seen by health care provider: Secondary | ICD-10-CM | POA: Insufficient documentation

## 2017-12-12 LAB — COMPREHENSIVE METABOLIC PANEL
ALBUMIN: 3.8 g/dL (ref 3.5–5.0)
ALK PHOS: 40 U/L (ref 38–126)
ALT: 11 U/L (ref 0–44)
AST: 20 U/L (ref 15–41)
Anion gap: 8 (ref 5–15)
BUN: 7 mg/dL (ref 6–20)
CALCIUM: 9.5 mg/dL (ref 8.9–10.3)
CHLORIDE: 105 mmol/L (ref 98–111)
CO2: 21 mmol/L — AB (ref 22–32)
CREATININE: 0.37 mg/dL — AB (ref 0.44–1.00)
GFR calc Af Amer: >60 mL/min (ref >60)
GFR calc non Af Amer: >60 mL/min (ref >60)
GLUCOSE: 122 mg/dL — AB (ref 70–99)
Potassium: 3.3 mmol/L — ABNORMAL LOW (ref 3.5–5.1)
SODIUM: 134 mmol/L — AB (ref 135–145)
Total Bilirubin: 0.4 mg/dL (ref 0.3–1.2)
Total Protein: 6.9 g/dL (ref 6.5–8.1)

## 2017-12-12 LAB — CBC
HCT: 33.7 % — ABNORMAL LOW (ref 35.0–47.0)
HEMOGLOBIN: 11.7 g/dL — AB (ref 12.0–16.0)
MCH: 32.6 pg (ref 26.0–34.0)
MCHC: 34.8 g/dL (ref 32.0–36.0)
MCV: 93.5 fL (ref 80.0–100.0)
Platelets: 170 10*3/uL (ref 150–440)
RBC: 3.6 MIL/uL — AB (ref 3.80–5.20)
RDW: 14.1 % (ref 11.5–14.5)
WBC: 11.1 10*3/uL — AB (ref 3.6–11.0)

## 2017-12-12 NOTE — ED Triage Notes (Signed)
Pt states that today at work she got hot and began to sweat and felt like she could not see or hear - pt then went to eat lunch and was fine - she states as long as she is cold she is fine but if she gets hot she gets dizzy

## 2017-12-12 NOTE — ED Notes (Signed)
Pt inquiring about her wait time, states she has been here for 3 hours.  At the time pt had only been in the ED for 2:25.  Pt in no distress. Molly Schmidt with patient relations spoke with the patient and informed her there were several other patients infront of her still.  Pt walked outside with visitor to sit.

## 2017-12-12 NOTE — ED Notes (Signed)
Pt up to registration desk again, asking about wait times. Informed her that I cannot give her a wait time, pt asking about her labs, informed her that the MD would be the one to discuss her work up, pt walked away and said "Fuck that" and stormed out of ED.

## 2018-01-06 DIAGNOSIS — F17210 Nicotine dependence, cigarettes, uncomplicated: Secondary | ICD-10-CM | POA: Insufficient documentation

## 2018-01-06 DIAGNOSIS — Z8659 Personal history of other mental and behavioral disorders: Secondary | ICD-10-CM | POA: Insufficient documentation

## 2018-01-06 LAB — OB RESULTS CONSOLE VARICELLA ZOSTER ANTIBODY, IGG: Varicella: IMMUNE

## 2018-01-06 LAB — OB RESULTS CONSOLE ABO/RH: RH Type: POSITIVE

## 2018-01-06 LAB — OB RESULTS CONSOLE RUBELLA ANTIBODY, IGM: Rubella: IMMUNE

## 2018-01-06 LAB — OB RESULTS CONSOLE HEPATITIS B SURFACE ANTIGEN: Hepatitis B Surface Ag: NEGATIVE

## 2018-01-06 LAB — OB RESULTS CONSOLE GC/CHLAMYDIA
Chlamydia: POSITIVE
Gonorrhea: NEGATIVE

## 2018-01-06 LAB — OB RESULTS CONSOLE ANTIBODY SCREEN: Antibody Screen: NEGATIVE

## 2018-01-06 LAB — OB RESULTS CONSOLE RPR: RPR: NONREACTIVE

## 2018-01-19 ENCOUNTER — Encounter: Payer: Self-pay | Admitting: Emergency Medicine

## 2018-01-19 ENCOUNTER — Emergency Department: Payer: Medicaid Other

## 2018-01-19 ENCOUNTER — Other Ambulatory Visit: Payer: Self-pay

## 2018-01-19 ENCOUNTER — Emergency Department
Admission: EM | Admit: 2018-01-19 | Discharge: 2018-01-19 | Disposition: A | Discharge status: Home / Self Care | Payer: Medicaid Other | Attending: Emergency Medicine | Admitting: Emergency Medicine

## 2018-01-19 DIAGNOSIS — J069 Acute upper respiratory infection, unspecified: Secondary | ICD-10-CM | POA: Diagnosis not present

## 2018-01-19 DIAGNOSIS — O26852 Spotting complicating pregnancy, second trimester: Secondary | ICD-10-CM | POA: Insufficient documentation

## 2018-01-19 DIAGNOSIS — R062 Wheezing: Secondary | ICD-10-CM

## 2018-01-19 DIAGNOSIS — O9952 Diseases of the respiratory system complicating childbirth: Secondary | ICD-10-CM | POA: Diagnosis not present

## 2018-01-19 DIAGNOSIS — Z3A15 15 weeks gestation of pregnancy: Secondary | ICD-10-CM | POA: Diagnosis not present

## 2018-01-19 DIAGNOSIS — O2312 Infections of bladder in pregnancy, second trimester: Secondary | ICD-10-CM | POA: Insufficient documentation

## 2018-01-19 DIAGNOSIS — O2342 Unspecified infection of urinary tract in pregnancy, second trimester: Secondary | ICD-10-CM

## 2018-01-19 DIAGNOSIS — O4690 Antepartum hemorrhage, unspecified, unspecified trimester: Secondary | ICD-10-CM

## 2018-01-19 LAB — URINALYSIS, COMPLETE (UACMP) WITH MICROSCOPIC
Bilirubin Urine: NEGATIVE
GLUCOSE, UA: NEGATIVE mg/dL
HGB URINE DIPSTICK: NEGATIVE
Ketones, ur: NEGATIVE mg/dL
Leukocytes, UA: NEGATIVE
NITRITE: NEGATIVE
Protein, ur: NEGATIVE mg/dL
SPECIFIC GRAVITY, URINE: 1.019 (ref 1.005–1.030)
pH: 6 (ref 5.0–8.0)

## 2018-01-19 LAB — ABO/RH: ABO/RH(D): B POS

## 2018-01-19 LAB — WET PREP, GENITAL
CLUE CELLS WET PREP: NONE SEEN
Sperm: NONE SEEN
TRICH WET PREP: NONE SEEN
Yeast Wet Prep HPF POC: NONE SEEN

## 2018-01-19 LAB — CHLAMYDIA/NGC RT PCR (ARMC ONLY)
Chlamydia Tr: DETECTED — AB
N gonorrhoeae: NOT DETECTED

## 2018-01-19 LAB — POCT PREGNANCY, URINE: Preg Test, Ur: POSITIVE — AB

## 2018-01-19 LAB — HCG, QUANTITATIVE, PREGNANCY: HCG, BETA CHAIN, QUANT, S: 141542 m[IU]/mL — AB (ref <5)

## 2018-01-19 MED ORDER — NITROFURANTOIN MONOHYD MACRO 100 MG PO CAPS: 100.0000 mg | ORAL_CAPSULE | Freq: Two times a day (BID) | ORAL | 0 refills | Status: DC

## 2018-01-19 MED ORDER — ALBUTEROL SULFATE HFA 108 (90 BASE) MCG/ACT IN AERS: 2.0000 | INHALATION_SPRAY | Freq: Four times a day (QID) | RESPIRATORY_TRACT | 0 refills | Status: DC | PRN

## 2018-01-19 MED ORDER — NITROFURANTOIN MONOHYD MACRO 100 MG PO CAPS
100.0000 mg | ORAL_CAPSULE | Freq: Once | ORAL | Status: AC
  Administered 2018-01-19: 100 mg via ORAL
  Filled 2018-01-19: qty 1

## 2018-01-19 MED ORDER — ALBUTEROL SULFATE (2.5 MG/3ML) 0.083% IN NEBU
2.5000 mg | INHALATION_SOLUTION | Freq: Once | RESPIRATORY_TRACT | Status: AC
  Administered 2018-01-19: 2.5 mg via RESPIRATORY_TRACT
  Filled 2018-01-19: qty 3

## 2018-01-19 NOTE — ED Notes (Signed)
Pt discharged home after verbalizing understanding of discharge instructions; nad noted. 

## 2018-01-19 NOTE — ED Triage Notes (Signed)
Pt reports [redacted] weeks pregnant and started having spotting today.  Had some pain earlier with it but pain has gotten better.  Ambulatory. VSS. NAD

## 2018-01-19 NOTE — ED Notes (Signed)
Pt also has cough with some mucous.  Explained with let MD see her for this, will not xray at this time.

## 2018-01-19 NOTE — ED Provider Notes (Signed)
[facility] Emergency Department Provider Note ____________________________________________   I have reviewed the triage vital signs and the triage nursing note.  HISTORY  Chief Complaint Vaginal Bleeding   Historian Patient  HPI Molly Schmidt is a 22 y.o. female who is proximally [redacted] weeks pregnant, states that this morning she wiped and noticed a small amount of vaginal spotting.  She has not had any vaginal spotting.  This is her first pregnancy.  She is been having some lower abdominal soreness on both sides.  No vaginal discharge.  No back pain.  No fever.  Reports waking up this morning with a dry cough.  Denies urinary frequency or dysuria.  Denies exposure to STDs.  Has had some nausea and vomiting with this pregnancy but she takes medication for that.     Past Medical History:  Diagnosis Date  . Environmental and seasonal allergies     There are no active problems to display for this patient.   History reviewed. No pertinent surgical history.  Prior to Admission medications   Medication Sig Start Date End Date Taking? Authorizing Provider  ondansetron (ZOFRAN ODT) 4 MG disintegrating tablet Take 1 tablet (4 mg total) by mouth every 6 (six) hours as needed for nausea or vomiting. 10/05/15  Yes Menshew, Charlesetta Ivory, PA-C  albuterol (PROVENTIL HFA;VENTOLIN HFA) 108 (90 Base) MCG/ACT inhaler Inhale 2 puffs into the lungs every 6 (six) hours as needed for wheezing or shortness of breath. 01/19/18   Governor Rooks, MD  aspirin-acetaminophen-caffeine (EXCEDRIN MIGRAINE) [phone] MG tablet Take 1 tablet every 6 (six) hours as needed by mouth for headache. Patient not taking: Reported on 01/19/2018 03/26/17   Cuthriell, Delorise Royals, PA-C  ibuprofen (ADVIL,MOTRIN) 600 MG tablet Take 1 tablet (600 mg total) by mouth every 8 (eight) hours as needed. Patient not taking: Reported on 12/26/2016 10/14/15   Joni Reining, PA-C  nitrofurantoin,  macrocrystal-monohydrate, (MACROBID) 100 MG capsule Take 1 capsule (100 mg total) by mouth 2 (two) times daily. 01/19/18   Governor Rooks, MD  ranitidine (ZANTAC) 150 MG capsule Take 1 capsule (150 mg total) by mouth 2 (two) times daily. Patient not taking: Reported on 12/26/2016 03/20/15   Sharman Cheek, MD  sucralfate (CARAFATE) 1 G tablet Take 1 tablet (1 g total) by mouth 4 (four) times daily. Patient not taking: Reported on 12/26/2016 03/20/15   Sharman Cheek, MD  traMADol (ULTRAM) 50 MG tablet Take 1 tablet (50 mg total) by mouth every 6 (six) hours as needed for moderate pain. Patient not taking: Reported on 12/26/2016 10/14/15   Joni Reining, PA-C    No Known Allergies  History reviewed. No pertinent family history.  Social History Social History   Tobacco Use  . Smoking status: Current Every Day Smoker    Packs/day: 0.25    Types: Cigarettes  . Smokeless tobacco: Never Used  Substance Use Topics  . Alcohol use: No  . Drug use: No    Review of Systems  Constitutional: Negative for fever. Eyes: Negative for visual changes. ENT: Negative for sore throat. Cardiovascular: Negative for chest pain. Respiratory: Positive for dry cough. Gastrointestinal: Negative for abdominal pain, vomiting and diarrhea. Genitourinary: Negative for dysuria. Musculoskeletal: Negative for back pain. Skin: Negative for rash. Neurological: Negative for headache.  ____________________________________________   PHYSICAL EXAM:  VITAL SIGNS: ED Triage Vitals  Enc Vitals Group     BP 01/19/18 1342 100/64     Pulse Rate 01/19/18 1342 82  Resp 01/19/18 1342 16     Temp --      Temp src --      SpO2 01/19/18 1342 97 %     Weight 01/19/18 1053 109 lb (49.4 kg)     Height --      Head Circumference --      Peak Flow --      Pain Score 01/19/18 1053 0     Pain Loc --      Pain Edu? --      Excl. in GC? --      Constitutional: Alert and oriented.  HEENT      Head: Normocephalic  and atraumatic.      Eyes: Conjunctivae are normal. Pupils equal and round.       Ears:         Nose: No congestion/rhinnorhea.      Mouth/Throat: Mucous membranes are moist.      Neck: No stridor. Cardiovascular/Chest: Normal rate, regular rhythm.  No murmurs, rubs, or gallops. Respiratory: Normal respiratory effort without tachypnea nor retractions.  Mild decreased breath sounds and tight breath sounds with dry cough.  No rhonchi Gastrointestinal: Soft. No distention, no guarding, no rebound. Nontender.  Fundus just at the pelvic rim. Genitourinary/rectal: No vaginal bleeding.  No vaginal discharge.  No cervical motion tenderness.  No adnexal tenderness or mass. Musculoskeletal: Nontender with normal range of motion in all extremities. No joint effusions.  No lower extremity tenderness.  No edema. Neurologic:  Normal speech and language. No gross or focal neurologic deficits are appreciated. Skin:  Skin is warm, dry and intact. No rash noted. Psychiatric: Mood and affect are normal. Speech and behavior are normal. Patient exhibits appropriate insight and judgment.   ____________________________________________  LABS (pertinent positives/negatives) I, Governor Rooks, MD the attending physician have reviewed the labs noted below.  Labs Reviewed  WET PREP, GENITAL - Abnormal; Notable for the following components:      Result Value   WBC, Wet Prep HPF POC RARE (*)    All other components within normal limits  HCG, QUANTITATIVE, PREGNANCY - Abnormal; Notable for the following components:   hCG, Beta Chain, Quant, S 141,542 (*)    All other components within normal limits  URINALYSIS, COMPLETE (UACMP) WITH MICROSCOPIC - Abnormal; Notable for the following components:   Color, Urine YELLOW (*)    APPearance HAZY (*)    Bacteria, UA RARE (*)    All other components within normal limits  POCT PREGNANCY, URINE - Abnormal; Notable for the following components:   Preg Test, Ur POSITIVE (*)     All other components within normal limits  CHLAMYDIA/NGC RT PCR ([facility] ONLY)  URINE CULTURE  POC URINE PREG, ED  ABO/RH    ____________________________________________    EKG I, Governor Rooks, MD, the attending physician have personally viewed and interpreted all ECGs.  None ____________________________________________  RADIOLOGY   Ultrasound limited: Radiologist report reviewed IMPRESSION: 1. Single living intrauterine gestation at 15 weeks 3 days by limited fetal biometry, concordant with provided menstrual dating. 2. No acute gestational abnormality. 3. Patient should obtain dedicated fetal anatomic survey in 4-6 weeks. __________________________________________  PROCEDURES  Procedure(s) performed: None  Procedures  Critical Care performed: None   ____________________________________________  ED COURSE / ASSESSMENT AND PLAN  Pertinent labs & imaging results that were available during my care of the patient were reviewed by me and considered in my medical decision making (see chart for details).    Patient here for  evaluation of vaginal spotting and second trimester.  No significant pain here, not suspicious of interabdominal surgical emergency.  No cervicitis or bleeding on vaginal exam.  We discussed that friable cervix due to hormonal changes may have caused the spotting.  No evidence for cervicitis, patient will be discharged prior to these being completed, if positive, nurse may call patient.  She is having tight breath sounds, no history of asthma but seems bronchospastic.  No hypoxia or fever or tachycardia or productive cough.  I am not suspicious of pneumonia at this point.  I am not suspicious of PE.  We did do a trial of albuterol to help to some to prescribe her albuterol   Albuterol helped significantly.  Urinalysis with rare bacteria, will go ahead out of an abundance of caution in pregnancy treat with Macrobid.  Urine culture was  sent.     CONSULTATIONS:   None   Patient / Family / Caregiver informed of clinical course, medical decision-making process, and agree with plan.   I discussed return precautions, follow-up instructions, and discharge instructions with patient and/or family.  Discharge Instructions : You are evaluated for vaginal spotting, and your exam and evaluation are overall reassuring in the emergency department today.  Return to the emergency room immediately for any new or worsening abdominal pain, any large amount of vaginal bleeding, back pain, or urinary symptoms.  Return for any fevers.  You are evaluated for dry cough, which is consistent with an upper respiratory infection that I suspect is probably a cold or virus.  You are being prescribed albuterol inhaler to help with the wheezing.  Return to emergency department immediately for any shortness of breath, worsening trouble breathing.  You are being treated for possible urinary tract infection given bacteria seen onyour urine test.    ___________________________________________   FINAL CLINICAL IMPRESSION(S) / ED DIAGNOSES   Final diagnoses:  Vaginal bleeding in pregnancy  Wheezing  Upper respiratory tract infection, unspecified type  UTI (urinary tract infection) during pregnancy, second trimester      ___________________________________________         Note: This dictation was prepared with Dragon dictation. Any transcriptional errors that result from this process are unintentional    Governor Rooks, MD 01/19/18 1644

## 2018-01-19 NOTE — Discharge Instructions (Addendum)
You are evaluated for vaginal spotting, and your exam and evaluation are overall reassuring in the emergency department today.  Return to the emergency room immediately for any new or worsening abdominal pain, any large amount of vaginal bleeding, back pain, or urinary symptoms.  Return for any fevers.  You are evaluated for dry cough, which is consistent with an upper respiratory infection that I suspect is probably a cold or virus.  You are being prescribed albuterol inhaler to help with the wheezing.  Return to emergency department immediately for any shortness of breath, worsening trouble breathing.  You are being treated for possible urinary tract infection given bacteria seen onyour urine test.

## 2018-01-21 LAB — URINE CULTURE

## 2018-03-30 IMAGING — US US PELVIS COMPLETE
1 series · 14 of 25 positions shown · non-contrast
Comparison: None

CLINICAL DATA: Generalized pelvic pain, nausea, vomiting for 2
weeks

EXAM:
TRANSABDOMINAL AND TRANSVAGINAL ULTRASOUND OF PELVIS
TECHNIQUE: Both transabdominal and transvaginal ultrasound examinations of the
pelvis were performed. Transabdominal technique was performed for
global imaging of the pelvis including uterus, ovaries, adnexal
regions, and pelvic cul-de-sac. It was necessary to proceed with
endovaginal exam following the transabdominal exam to visualize the
uterus, endometrium, ovaries and adnexa .

[Series 1: us pelvis complete · 0.18mm/px · 14 of 106 slices shown]
[im 1/106]
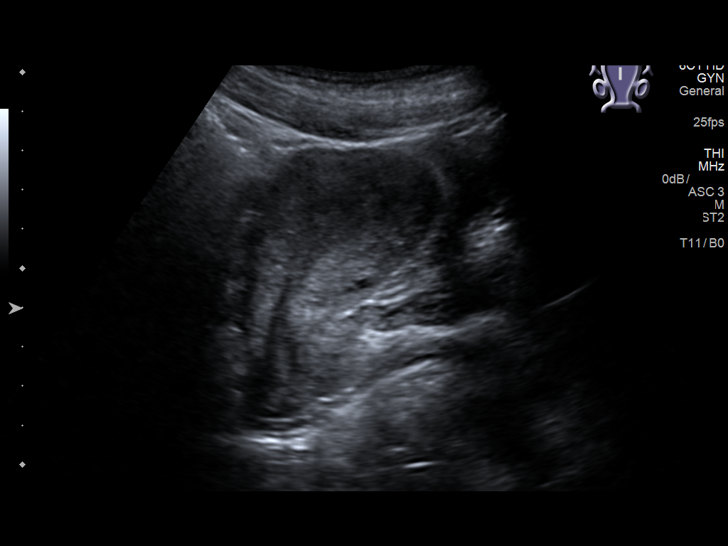
[im 9/106]
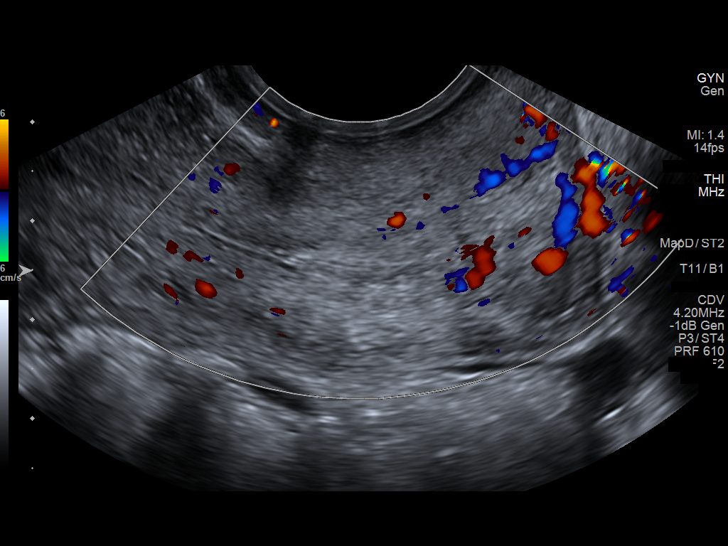
[im 18/106]
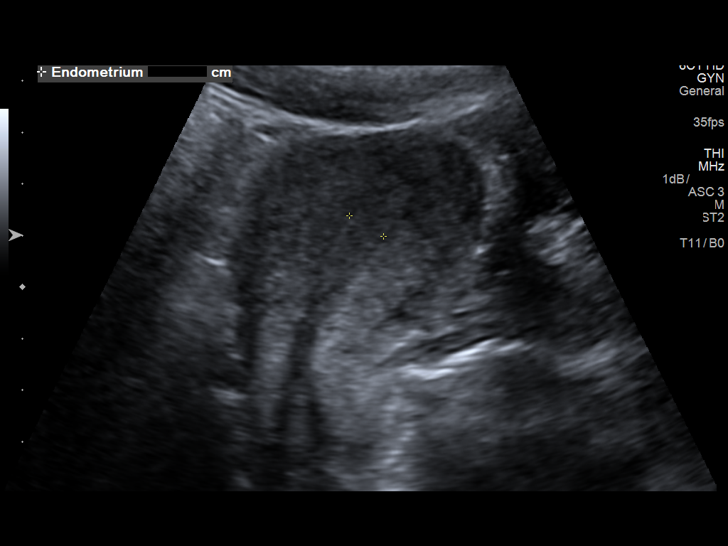
[im 27/106]
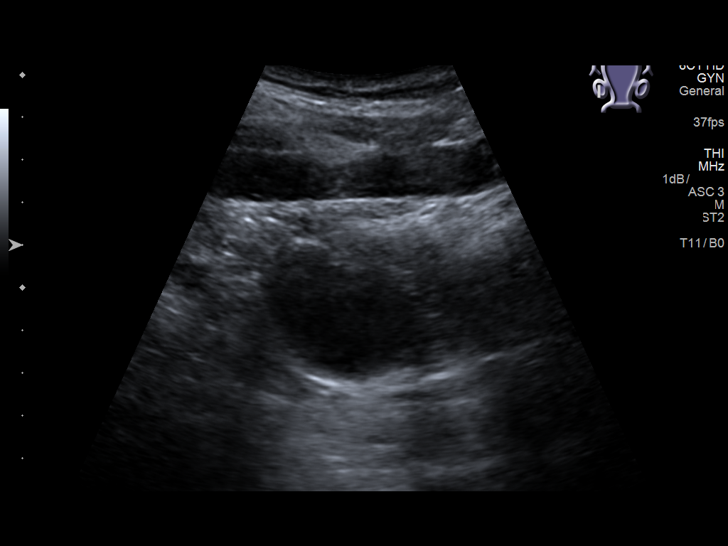
[im 36/106]
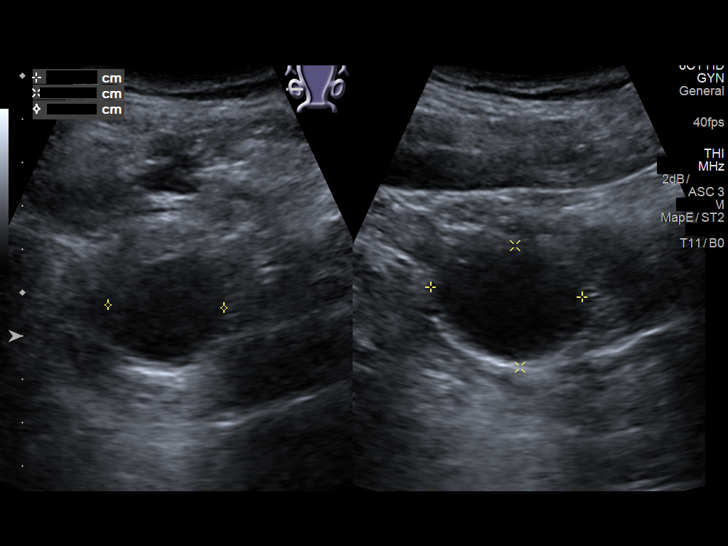
[im 40/106]
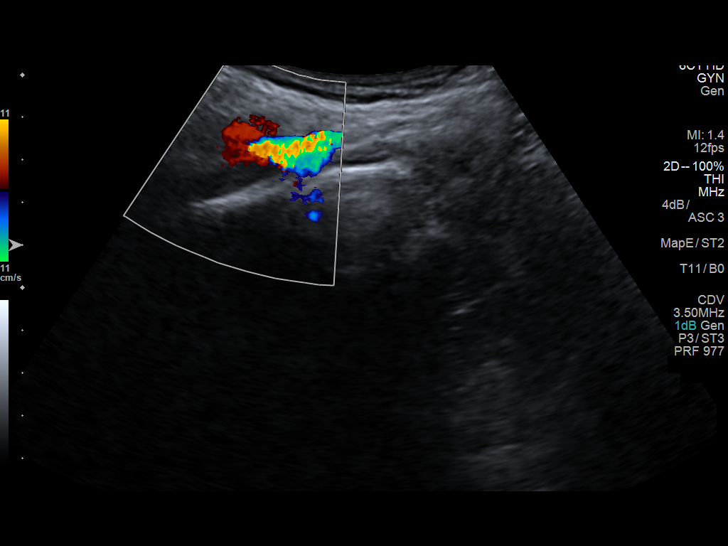
[im 49/106]
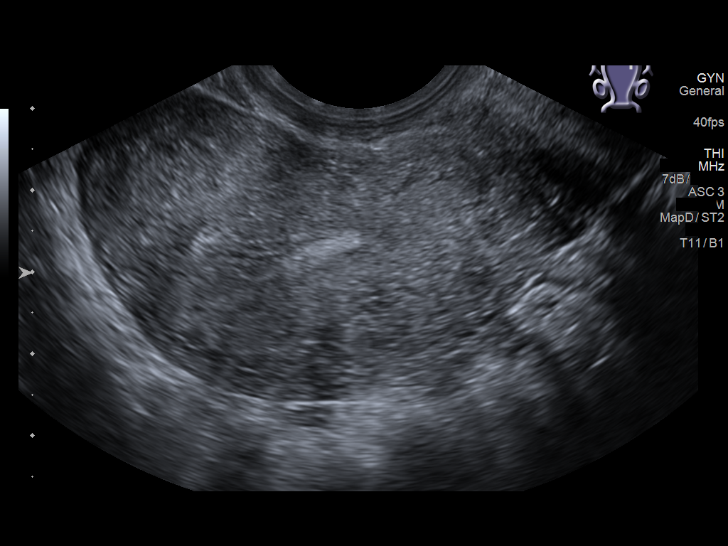
[im 57/106]
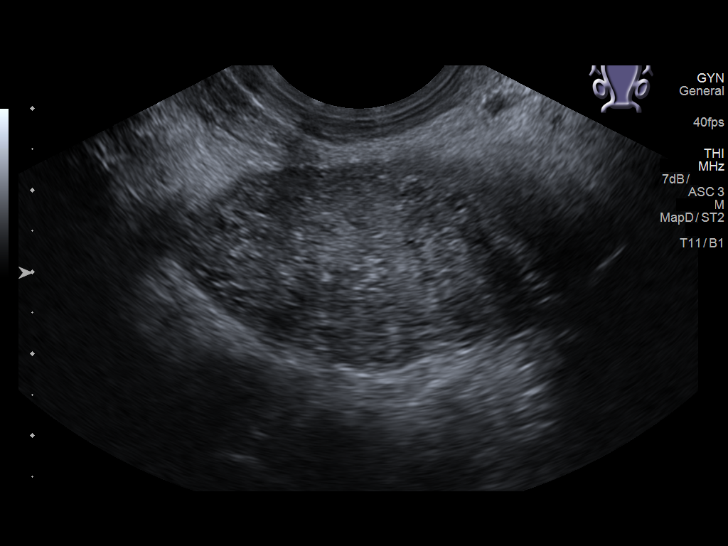
[im 66/106]
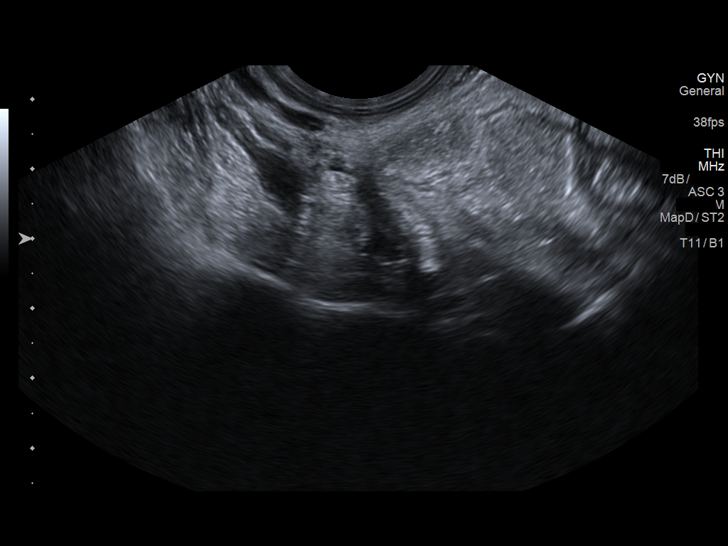
[im 71/106]
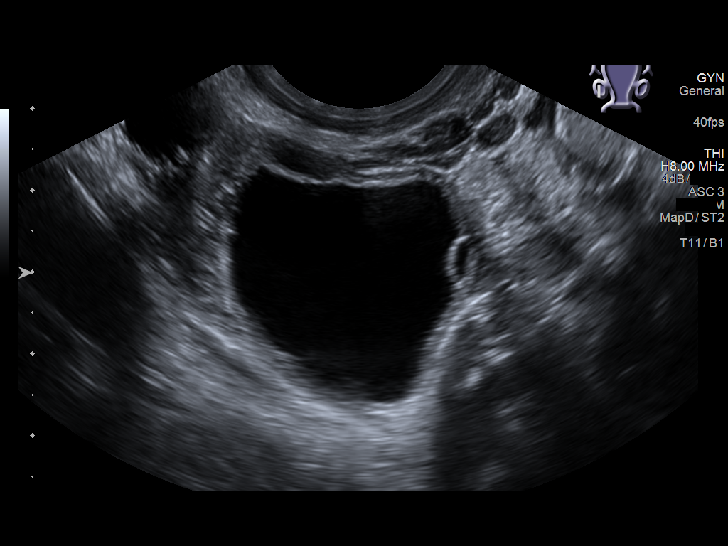
[im 79/106]
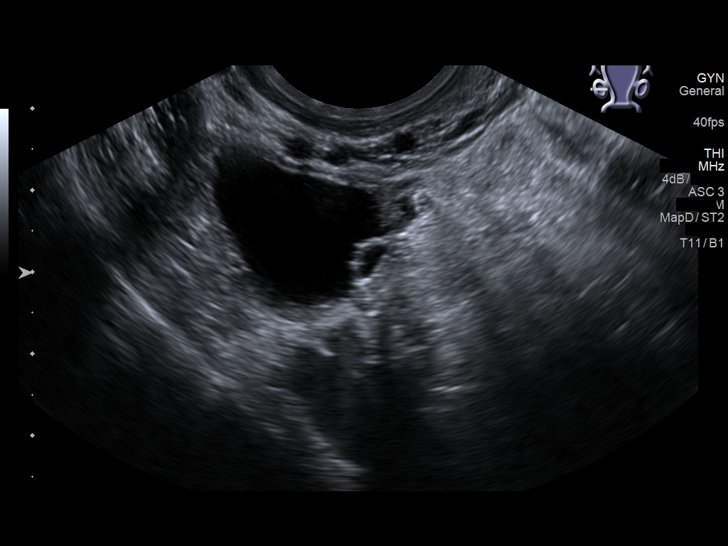
[im 88/106]
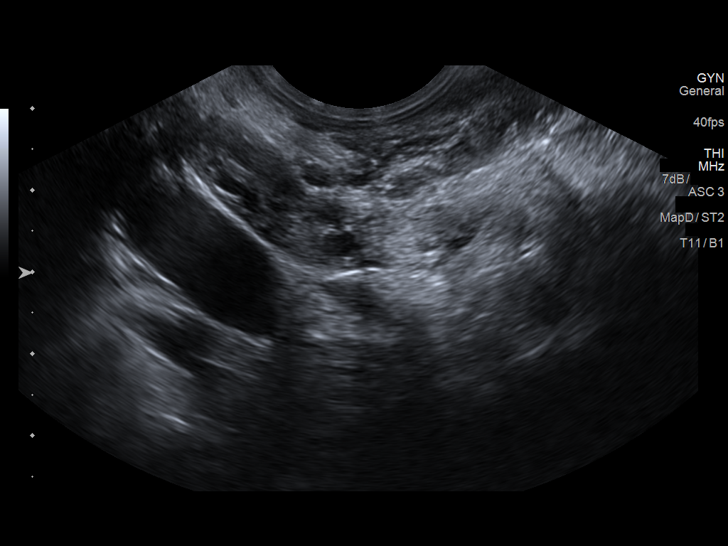
[im 97/106]
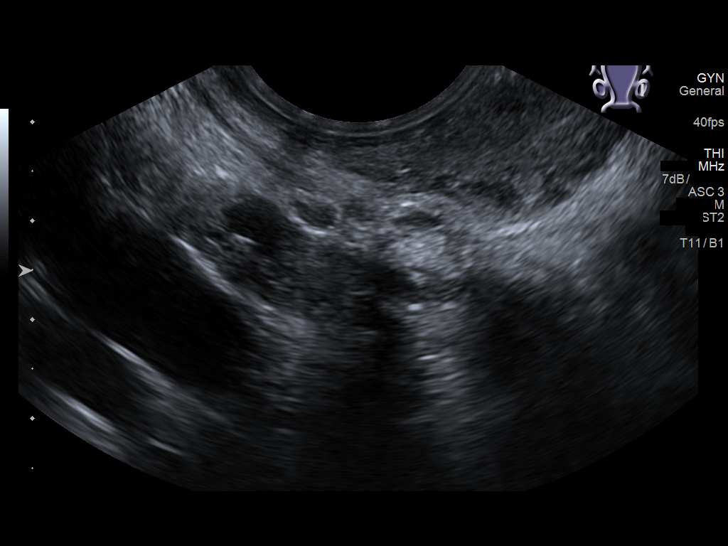
[im 106/106]
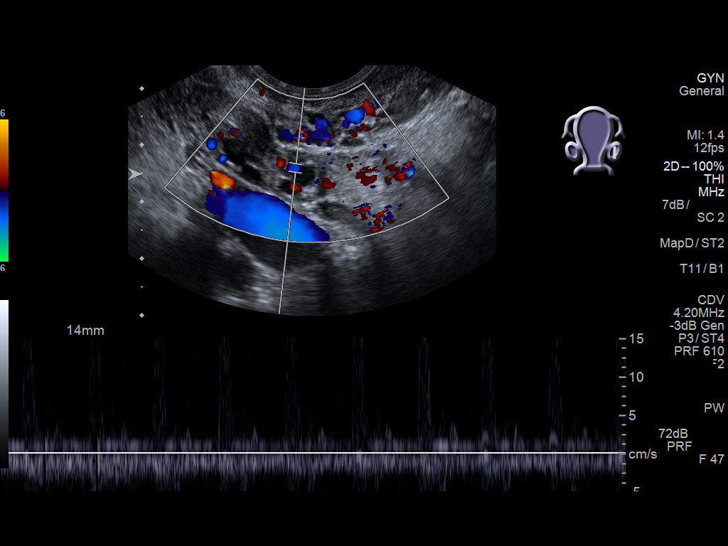

[14 of 25 positions shown; findings below may reference images not displayed]

FINDINGS: Uterus

Measurements: 7.4 x 4.3 x 4.7 cm. No fibroids or other mass
visualized.

Endometrium

Thickness: 7 mm in thickness.  No focal abnormality visualized.

Right ovary

Measurements: 4.1 x 3.4 x 3.2 cm. Dominant follicle or small cyst in
the right ovary measures 2.8 cm in maximum diameter.

Left ovary

Measurements: 2.9 x 1.1 x 2.1 cm. Normal appearance/no adnexal mass.

Other findings

No abnormal free fluid.
IMPRESSION: No acute or significant abnormality.

## 2018-04-13 ENCOUNTER — Emergency Department
Admission: EM | Admit: 2018-04-13 | Discharge: 2018-04-13 | Disposition: A | Discharge status: Home / Self Care | Payer: Medicaid Other | Attending: Emergency Medicine | Admitting: Emergency Medicine

## 2018-04-13 ENCOUNTER — Other Ambulatory Visit: Payer: Self-pay

## 2018-04-13 ENCOUNTER — Emergency Department: Payer: Medicaid Other

## 2018-04-13 DIAGNOSIS — F17210 Nicotine dependence, cigarettes, uncomplicated: Secondary | ICD-10-CM | POA: Diagnosis not present

## 2018-04-13 DIAGNOSIS — Z79899 Other long term (current) drug therapy: Secondary | ICD-10-CM | POA: Diagnosis not present

## 2018-04-13 DIAGNOSIS — M25562 Pain in left knee: Secondary | ICD-10-CM

## 2018-04-13 DIAGNOSIS — O99332 Smoking (tobacco) complicating pregnancy, second trimester: Secondary | ICD-10-CM | POA: Insufficient documentation

## 2018-04-13 DIAGNOSIS — Z3A27 27 weeks gestation of pregnancy: Secondary | ICD-10-CM | POA: Diagnosis not present

## 2018-04-13 DIAGNOSIS — O9989 Other specified diseases and conditions complicating pregnancy, childbirth and the puerperium: Secondary | ICD-10-CM | POA: Diagnosis not present

## 2018-04-13 NOTE — Discharge Instructions (Signed)
[facility] your primary care provider or [facility].  Keep your appointment with your obstetrician.  Wear knee immobilizer for support and protection.  You may ice and elevate your knee as needed for discomfort and/or swelling.  You may take limited amounts of Tylenol for pain if needed.  Do not take more than 1 tablet every 6 hours due to your pregnancy.

## 2018-04-13 NOTE — ED Triage Notes (Signed)
Pt c/o left knee and hip pain intermittent over the past month, states she fx it a couple of years ago and it flares up.

## 2018-04-13 NOTE — ED Provider Notes (Signed)
[facility] Emergency Department Provider Note  ____________________________________________   First MD Initiated Contact with Patient 04/13/18 1649     (approximate)  I have reviewed the triage vital signs and the nursing notes.   HISTORY  Chief Complaint Knee Pain  HPI Molly Schmidt is a 22 y.o. female resents to the ED with complaint of left knee pain for 2 months.  Patient states that 2 years ago she injured her knee when she fell into a hole.  Currently she states that the pain was worse last evening.  She did not take any over-the-counter medication due to her 27-week pregnancy.  Patient denies any recent injury.  Rates her pain as 6/10.  Past Medical History:  Diagnosis Date  . Environmental and seasonal allergies     There are no active problems to display for this patient.   History reviewed. No pertinent surgical history.  Prior to Admission medications   Medication Sig Start Date End Date Taking? Authorizing Provider  albuterol (PROVENTIL HFA;VENTOLIN HFA) 108 (90 Base) MCG/ACT inhaler Inhale 2 puffs into the lungs every 6 (six) hours as needed for wheezing or shortness of breath. 01/19/18   Governor Rooks, MD  aspirin-acetaminophen-caffeine (EXCEDRIN MIGRAINE) [phone] MG tablet Take 1 tablet every 6 (six) hours as needed by mouth for headache. Patient not taking: Reported on 01/19/2018 03/26/17   Cuthriell, Delorise Royals, PA-C  ibuprofen (ADVIL,MOTRIN) 600 MG tablet Take 1 tablet (600 mg total) by mouth every 8 (eight) hours as needed. Patient not taking: Reported on 12/26/2016 10/14/15   Joni Reining, PA-C  nitrofurantoin, macrocrystal-monohydrate, (MACROBID) 100 MG capsule Take 1 capsule (100 mg total) by mouth 2 (two) times daily. 01/19/18   Governor Rooks, MD  ondansetron (ZOFRAN ODT) 4 MG disintegrating tablet Take 1 tablet (4 mg total) by mouth every 6 (six) hours as needed for nausea or vomiting. 10/05/15   Menshew, Charlesetta Ivory, PA-C    ranitidine (ZANTAC) 150 MG capsule Take 1 capsule (150 mg total) by mouth 2 (two) times daily. Patient not taking: Reported on 12/26/2016 03/20/15   Sharman Cheek, MD  sucralfate (CARAFATE) 1 G tablet Take 1 tablet (1 g total) by mouth 4 (four) times daily. Patient not taking: Reported on 12/26/2016 03/20/15   Sharman Cheek, MD  traMADol (ULTRAM) 50 MG tablet Take 1 tablet (50 mg total) by mouth every 6 (six) hours as needed for moderate pain. Patient not taking: Reported on 12/26/2016 10/14/15   Joni Reining, PA-C    Allergies Patient has no known allergies.  No family history on file.  Social History Social History   Tobacco Use  . Smoking status: Current Every Day Smoker    Packs/day: 0.25    Types: Cigarettes  . Smokeless tobacco: Never Used  Substance Use Topics  . Alcohol use: No  . Drug use: No    Review of Systems Constitutional: No fever/chills Cardiovascular: Denies chest pain. Respiratory: Denies shortness of breath. Musculoskeletal: Positive for left knee pain. Skin: Negative for rash. Neurological: Negative forfocal weakness or numbness. ____________________________________________   PHYSICAL EXAM:  VITAL SIGNS: ED Triage Vitals [04/13/18 1640]  Enc Vitals Group     BP 100/67     Pulse Rate 73     Resp 17     Temp 98.1 F (36.7 C)     Temp Source Oral     SpO2 100 %     Weight 125 lb (56.7 kg)     Height 5'  5" (1.651 m)     Head Circumference      Peak Flow      Pain Score 6     Pain Loc      Pain Edu?      Excl. in GC?    Constitutional: Alert and oriented. Well appearing and in no acute distress. Eyes: Conjunctivae are normal.  Head: Atraumatic. Neck: No stridor.   Cardiovascular: Normal rate, regular rhythm. Grossly normal heart sounds.  Good peripheral circulation. Respiratory: Normal respiratory effort.  No retractions. Lungs CTAB. Musculoskeletal: Examination of the left knee there is no gross deformity.  There is minimal diffuse  tenderness on palpation of the patella.  No effusion is present.  Range of motion is without crepitus.  There is some minimal lateral ligament instability with stress.  No instability is noted with medial aspect.  Skin is intact.  No erythema or warmth is noted.  No edema noted to the lower extremity. Neurologic:  Normal speech and language. No gross focal neurologic deficits are appreciated. No gait instability. Skin:  Skin is warm, dry and intact. No rash noted. Psychiatric: Mood and affect are normal. Speech and behavior are normal.  ____________________________________________   LABS (all labs ordered are listed, but only abnormal results are displayed)  Labs Reviewed - No data to display  PROCEDURES  Procedure(s) performed: Knee immobilizer was applied to the left knee by the ED tech.  Procedures  Critical Care performed: No  ____________________________________________   INITIAL IMPRESSION / ASSESSMENT AND PLAN / ED COURSE  As part of my medical decision making, I reviewed the following data within the electronic MEDICAL RECORD NUMBER Notes from prior ED visits and [place] Controlled Substance Database  Patient presents to the ED with complaint of left knee pain for 2 months however in the last 24 hours her pain has worsened.  She denies any recent injury.  She states that the pain comes and goes intermittently since she hurt her knee 2 years ago when she stepped in a hole.  She has not taken any over-the-counter medication as she is [redacted] weeks pregnant.  Physical exam there is some instability of the lateral collateral ligament.  No evidence of edema or erythema is present.  There is generalized tenderness on palpation of the patella without effusion.  Patient was placed in a knee immobilizer and told she may take limited doses of Tylenol and ice and elevate as needed.  She is encouraged to keep her OB/GYN appointments.  She is to return to the ED if any severe worsening of her knee or  follow-up with her PCP. ____________________________________________   FINAL CLINICAL IMPRESSION(S) / ED DIAGNOSES  Final diagnoses:  Acute pain of left knee     ED Discharge Orders    None       Note:  This document was prepared using Dragon voice recognition software and may include unintentional dictation errors.    Tommi RumpsSummers, Weltha Cathy L, PA-C 04/13/18 1729    Governor RooksLord, Rebecca, MD 04/13/18 [phone]

## 2018-05-02 ENCOUNTER — Observation Stay
Admission: EM | Admit: 2018-05-02 | Discharge: 2018-05-03 | Disposition: A | Discharge status: Home / Self Care | Payer: Medicaid Other | Attending: Obstetrics and Gynecology | Admitting: Obstetrics and Gynecology

## 2018-05-02 DIAGNOSIS — Z3A30 30 weeks gestation of pregnancy: Secondary | ICD-10-CM | POA: Insufficient documentation

## 2018-05-02 DIAGNOSIS — O26893 Other specified pregnancy related conditions, third trimester: Principal | ICD-10-CM | POA: Insufficient documentation

## 2018-05-02 DIAGNOSIS — Z87891 Personal history of nicotine dependence: Secondary | ICD-10-CM | POA: Insufficient documentation

## 2018-05-02 DIAGNOSIS — Z7982 Long term (current) use of aspirin: Secondary | ICD-10-CM | POA: Insufficient documentation

## 2018-05-02 DIAGNOSIS — Z79899 Other long term (current) drug therapy: Secondary | ICD-10-CM | POA: Insufficient documentation

## 2018-05-02 DIAGNOSIS — R102 Pelvic and perineal pain: Secondary | ICD-10-CM | POA: Diagnosis present

## 2018-05-02 DIAGNOSIS — N760 Acute vaginitis: Secondary | ICD-10-CM | POA: Insufficient documentation

## 2018-05-02 DIAGNOSIS — N949 Unspecified condition associated with female genital organs and menstrual cycle: Secondary | ICD-10-CM | POA: Diagnosis present

## 2018-05-03 ENCOUNTER — Other Ambulatory Visit: Payer: Self-pay

## 2018-05-03 DIAGNOSIS — R102 Pelvic and perineal pain: Secondary | ICD-10-CM | POA: Diagnosis present

## 2018-05-03 DIAGNOSIS — N949 Unspecified condition associated with female genital organs and menstrual cycle: Secondary | ICD-10-CM | POA: Diagnosis present

## 2018-05-03 DIAGNOSIS — N760 Acute vaginitis: Secondary | ICD-10-CM | POA: Diagnosis not present

## 2018-05-03 DIAGNOSIS — O26893 Other specified pregnancy related conditions, third trimester: Secondary | ICD-10-CM

## 2018-05-03 DIAGNOSIS — Z3A30 30 weeks gestation of pregnancy: Secondary | ICD-10-CM | POA: Diagnosis not present

## 2018-05-03 DIAGNOSIS — Z79899 Other long term (current) drug therapy: Secondary | ICD-10-CM | POA: Diagnosis not present

## 2018-05-03 DIAGNOSIS — Z87891 Personal history of nicotine dependence: Secondary | ICD-10-CM | POA: Diagnosis not present

## 2018-05-03 DIAGNOSIS — N898 Other specified noninflammatory disorders of vagina: Secondary | ICD-10-CM | POA: Diagnosis present

## 2018-05-03 DIAGNOSIS — Z7982 Long term (current) use of aspirin: Secondary | ICD-10-CM | POA: Diagnosis not present

## 2018-05-03 LAB — URINALYSIS, ROUTINE W REFLEX MICROSCOPIC
BILIRUBIN URINE: NEGATIVE
Glucose, UA: NEGATIVE mg/dL
Hgb urine dipstick: NEGATIVE
KETONES UR: NEGATIVE mg/dL
Leukocytes, UA: NEGATIVE
Nitrite: NEGATIVE
Protein, ur: NEGATIVE mg/dL
Specific Gravity, Urine: 1.004 — ABNORMAL LOW (ref 1.005–1.030)
pH: 6 (ref 5.0–8.0)

## 2018-05-03 LAB — WET PREP, GENITAL
Sperm: NONE SEEN
Trich, Wet Prep: NONE SEEN
Yeast Wet Prep HPF POC: NONE SEEN

## 2018-05-03 LAB — URINE DRUG SCREEN, QUALITATIVE (ARMC ONLY)
Amphetamines, Ur Screen: NOT DETECTED
Barbiturates, Ur Screen: NOT DETECTED
Benzodiazepine, Ur Scrn: NOT DETECTED
Cannabinoid 50 Ng, Ur ~~LOC~~: NOT DETECTED
Cocaine Metabolite,Ur ~~LOC~~: NOT DETECTED
MDMA (Ecstasy)Ur Screen: NOT DETECTED
Methadone Scn, Ur: NOT DETECTED
Opiate, Ur Screen: NOT DETECTED
Phencyclidine (PCP) Ur S: NOT DETECTED
Tricyclic, Ur Screen: NOT DETECTED

## 2018-05-03 LAB — CHLAMYDIA/NGC RT PCR (ARMC ONLY)
Chlamydia Tr: NOT DETECTED
N GONORRHOEAE: NOT DETECTED

## 2018-05-03 MED ORDER — ACETAMINOPHEN 325 MG PO TABS: 650.0000 mg | ORAL_TABLET | ORAL | Status: DC | PRN

## 2018-05-03 MED ORDER — METRONIDAZOLE 500 MG PO TABS: 500.0000 mg | ORAL_TABLET | Freq: Two times a day (BID) | ORAL | 0 refills | Status: AC

## 2018-05-03 MED ORDER — ACETAMINOPHEN 325 MG PO TABS: 650.0000 mg | ORAL_TABLET | ORAL | Status: AC | PRN

## 2018-05-03 NOTE — OB Triage Note (Signed)
Patient came in from home stating "I feel like my uterus is coming out my vagina". Patient describes pressure like feeling that started 2 days ago. No pain associated with the pressure. Patient denies vaginal bleeding, denies LOF. Reports positiveFM. Vitals WNL.

## 2018-05-03 NOTE — Discharge Instructions (Signed)

## 2018-05-03 NOTE — Discharge Summary (Signed)
Molly Schmidt is a 22 y.o. female. She is at 3722w2d gestation. Patient's last menstrual period was 10/25/2017 (exact date). Estimated Date of Delivery: 07/10/18  [facility]   Current pregnancy complicated by:  1. Chlamydia, Pos- Aug and Sept 2019; Neg TOC 02/05/18 2. Anxiety/depression- no meds 3. Current smoker, few cigarettes daily.   Chief complaint: pressure, feels like "uterus is falling out through her vagina"  Location: vagina Onset/timing: 2 days ago Duration: constant Quality: pressure Severity: no pain associated with pressure Aggravating or alleviating conditions: Had intercourse tonight before coming to hospital, made pressure worse.  Associated signs/symptoms: vaginal discharge.  Context: seen last in office on 04/21/18- 28wk labs done, mild anemia.   S: Resting comfortably. no CTX, no VB.no LOF,  Active fetal movement.  Denies: HA, visual changes, SOB, or RUQ/epigastric pain  Maternal Medical History:   Past Medical History:  Diagnosis Date  . Environmental and seasonal allergies     History reviewed. No pertinent surgical history.  No Known Allergies  Prior to Admission medications   Medication Sig Start Date End Date Taking? Authorizing Provider  albuterol (PROVENTIL HFA;VENTOLIN HFA) 108 (90 Base) MCG/ACT inhaler Inhale 2 puffs into the lungs every 6 (six) hours as needed for wheezing or shortness of breath. 01/19/18  Yes Governor RooksLord, Caprisha Bridgett, MD  ondansetron (ZOFRAN ODT) 4 MG disintegrating tablet Take 1 tablet (4 mg total) by mouth every 6 (six) hours as needed for nausea or vomiting. 10/05/15  Yes Menshew, Charlesetta IvoryJenise V Bacon, PA-C  aspirin-acetaminophen-caffeine (EXCEDRIN MIGRAINE) [phone] MG tablet Take 1 tablet every 6 (six) hours as needed by mouth for headache. Patient not taking: Reported on 01/19/2018 03/26/17   Cuthriell, Delorise RoyalsJonathan D, PA-C  ibuprofen (ADVIL,MOTRIN) 600 MG tablet Take 1 tablet (600 mg total) by mouth every 8 (eight)  hours as needed. Patient not taking: Reported on 12/26/2016 10/14/15   Joni ReiningSmith, Ronald K, PA-C  nitrofurantoin, macrocrystal-monohydrate, (MACROBID) 100 MG capsule Take 1 capsule (100 mg total) by mouth 2 (two) times daily. Patient not taking: Reported on 05/03/2018 01/19/18   Governor RooksLord, Shayonna Ocampo, MD  ranitidine (ZANTAC) 150 MG capsule Take 1 capsule (150 mg total) by mouth 2 (two) times daily. Patient not taking: Reported on 12/26/2016 03/20/15   Sharman CheekStafford, Phillip, MD  sucralfate (CARAFATE) 1 G tablet Take 1 tablet (1 g total) by mouth 4 (four) times daily. Patient not taking: Reported on 12/26/2016 03/20/15   Sharman CheekStafford, Phillip, MD  traMADol (ULTRAM) 50 MG tablet Take 1 tablet (50 mg total) by mouth every 6 (six) hours as needed for moderate pain. Patient not taking: Reported on 12/26/2016 10/14/15   Joni ReiningSmith, Ronald K, PA-C      Social History: She  reports that she quit smoking about 3 weeks ago. Her smoking use included cigarettes. She has never used smokeless tobacco. She reports that she does not drink alcohol or use drugs.  Family History:  no history of gyn cancers  Review of Systems: A full review of systems was performed and negative except as noted in the HPI.     O:  BP 107/63 (BP Location: Left Arm)   Pulse 78   Temp 98.3 F (36.8 C) (Oral)   Resp 16   LMP 10/25/2017 (Exact Date)   Breastfeeding Unknown  Results for orders placed or performed during the hospital encounter of 05/02/18 (from the past 48 hour(s))  Wet prep, genital   Collection Time: 05/03/18 12:50 AM  Result Value Ref Range   Yeast Wet  Prep HPF POC NONE SEEN NONE SEEN   Trich, Wet Prep NONE SEEN NONE SEEN   Clue Cells Wet Prep HPF POC PRESENT (A) NONE SEEN   WBC, Wet Prep HPF POC RARE (A) NONE SEEN   Sperm NONE SEEN   Urinalysis, Routine w reflex microscopic   Collection Time: 05/03/18 12:50 AM  Result Value Ref Range   Color, Urine STRAW (A) YELLOW   APPearance CLEAR (A) CLEAR   Specific Gravity, Urine 1.004 (L) 1.005  - 1.030   pH 6.0 5.0 - 8.0   Glucose, UA NEGATIVE NEGATIVE mg/dL   Hgb urine dipstick NEGATIVE NEGATIVE   Bilirubin Urine NEGATIVE NEGATIVE   Ketones, ur NEGATIVE NEGATIVE mg/dL   Protein, ur NEGATIVE NEGATIVE mg/dL   Nitrite NEGATIVE NEGATIVE   Leukocytes, UA NEGATIVE NEGATIVE  Urine Drug Screen, Qualitative ([facility] only)   Collection Time: 05/03/18  1:18 AM  Result Value Ref Range   Tricyclic, Ur Screen NONE DETECTED NONE DETECTED   Amphetamines, Ur Screen NONE DETECTED NONE DETECTED   MDMA (Ecstasy)Ur Screen NONE DETECTED NONE DETECTED   Cocaine Metabolite,Ur [place] NONE DETECTED NONE DETECTED   Opiate, Ur Screen NONE DETECTED NONE DETECTED   Phencyclidine (PCP) Ur S NONE DETECTED NONE DETECTED   Cannabinoid 50 Ng, Ur [place] NONE DETECTED NONE DETECTED   Barbiturates, Ur Screen NONE DETECTED NONE DETECTED   Benzodiazepine, Ur Scrn NONE DETECTED NONE DETECTED   Methadone Scn, Ur NONE DETECTED NONE DETECTED     Constitutional: NAD, AAOx3  HE/ENT: extraocular movements grossly intact, moist mucous membranes CV: RRR PULM: nl respiratory effort, CTABL     Abd: gravid, non-tender, non-distended, soft      Ext: Non-tender, Nonedematous   Psych: mood appropriate, speech normal Pelvic: wet prep, GC/CT obtained. + vaginal discharge.    Fetal  monitoring: Cat I Appropriate for GA Baseline: 140bpm Variability: moderate Accelerations:  present x >2 Decelerations absent  Toco: No UCs   A/P: 22 y.o. 1142w2d here for antenatal surveillance for pelvic pressure  Principle Diagnosis:  Bacterial vaginosis   No e/o preterm labor, no contractions.   Fetal Wellbeing: Reassuring Cat 1 tracing with Reactive NST   Wet prep + Clue cells, Rx flagyl to pharmacy  D/c home stable, precautions reviewed, follow-up as scheduled.    Randa NgoRebecca A Kayleeann Huxford, CNM 05/03/2018  1:59 AM

## 2018-05-04 LAB — URINE CULTURE: Culture: <10000 — AB

## 2018-05-13 NOTE — L&D Delivery Note (Signed)
Delivery Note  First Stage: Labor onset: 2/27 at 2200; admitted at 11am 2/28 Augmentation : AROM, pitocin Analgesia Eliezer Lofts intrapartum: epidural AROM at 1530, 07/10/18  Second Stage: Complete dilation at 1825 Onset of pushing at 1825 FHR second stage Cat II, 150bpm, variable decels  Delivery of a viable female infant on 07/10/18 at 1857 by Heloise Ochoa CNM. delivery of fetal head in LOA position with restitution to LOT. No nuchal cord;  Anterior then posterior shoulders delivered easily with gentle downward traction. Baby placed on mom's chest, and attended to by peds.  Cord double clamped after cessation of pulsation, cut by FOB.   Third Stage: Placenta delivered spontaneously intact with 3VC @ 1903 Placenta disposition: routine disposal Uterine tone Firm / bleeding small  Bilateral labial lacerations identified  Anesthesia for repair: epidural Repair: in usual fashion with 3-0 vicryl SH x 1 Est. Blood Loss (mL): 200  Complications: none  Mom to postpartum.  Baby to Couplet care / Skin to Skin.  Newborn: Birth Weight: 6#15  Apgar Scores: 7/9 Feeding planned: breast/formula

## 2018-05-25 ENCOUNTER — Observation Stay
Admission: EM | Admit: 2018-05-25 | Discharge: 2018-05-26 | Disposition: A | Discharge status: Home / Self Care | Payer: Medicaid Other | Attending: Obstetrics and Gynecology | Admitting: Obstetrics and Gynecology

## 2018-05-25 DIAGNOSIS — R1032 Left lower quadrant pain: Secondary | ICD-10-CM | POA: Diagnosis present

## 2018-05-25 DIAGNOSIS — R112 Nausea with vomiting, unspecified: Secondary | ICD-10-CM | POA: Insufficient documentation

## 2018-05-25 DIAGNOSIS — O26893 Other specified pregnancy related conditions, third trimester: Principal | ICD-10-CM | POA: Insufficient documentation

## 2018-05-25 DIAGNOSIS — Z3490 Encounter for supervision of normal pregnancy, unspecified, unspecified trimester: Secondary | ICD-10-CM

## 2018-05-25 DIAGNOSIS — Z3A34 34 weeks gestation of pregnancy: Secondary | ICD-10-CM | POA: Insufficient documentation

## 2018-05-25 LAB — URINALYSIS, COMPLETE (UACMP) WITH MICROSCOPIC
Bacteria, UA: NONE SEEN
Bilirubin Urine: NEGATIVE
GLUCOSE, UA: NEGATIVE mg/dL
Hgb urine dipstick: NEGATIVE
Ketones, ur: NEGATIVE mg/dL
Leukocytes, UA: NEGATIVE
Nitrite: NEGATIVE
Protein, ur: NEGATIVE mg/dL
Specific Gravity, Urine: 1.005 (ref 1.005–1.030)
pH: 7 (ref 5.0–8.0)

## 2018-05-25 MED ORDER — DEXTROSE 5 % IN LACTATED RINGERS IV BOLUS
1000.0000 mL | Freq: Once | INTRAVENOUS | Status: AC
  Administered 2018-05-25: 1000 mL via INTRAVENOUS

## 2018-05-25 MED ORDER — ACETAMINOPHEN 500 MG PO TABS
1000.0000 mg | ORAL_TABLET | Freq: Once | ORAL | Status: AC
  Administered 2018-05-25: 1000 mg via ORAL
  Filled 2018-05-25: qty 2

## 2018-05-25 NOTE — OB Triage Note (Signed)
Recvd pt from ED. Pt c/o left lower abdominal pain. States she feels weak, hot and cold, and threw up several times around 2000. Feeling baby move well. No vaginal bleeding or LOF. Rates pain a 7 out of 10.

## 2018-05-26 DIAGNOSIS — O26893 Other specified pregnancy related conditions, third trimester: Secondary | ICD-10-CM | POA: Diagnosis not present

## 2018-05-26 DIAGNOSIS — Z3490 Encounter for supervision of normal pregnancy, unspecified, unspecified trimester: Secondary | ICD-10-CM

## 2018-05-26 NOTE — Discharge Instructions (Signed)

## 2018-06-01 NOTE — Discharge Summary (Signed)
34 week with left lower pain and nausea and emesis .  Observation and reassuring fetal monitoring and pt felt better and d/c to home in good condition according to RN

## 2018-06-23 LAB — OB RESULTS CONSOLE GC/CHLAMYDIA
Chlamydia: POSITIVE
Gonorrhea: NEGATIVE

## 2018-06-23 LAB — OB RESULTS CONSOLE HIV ANTIBODY (ROUTINE TESTING): HIV: NONREACTIVE

## 2018-06-23 LAB — OB RESULTS CONSOLE RPR: RPR: NONREACTIVE

## 2018-06-23 LAB — OB RESULTS CONSOLE GBS: GBS: NEGATIVE

## 2018-07-08 ENCOUNTER — Observation Stay
Admission: EM | Admit: 2018-07-08 | Discharge: 2018-07-08 | Disposition: A | Discharge status: Home / Self Care | Payer: Medicaid Other | Attending: Obstetrics and Gynecology | Admitting: Obstetrics and Gynecology

## 2018-07-08 ENCOUNTER — Other Ambulatory Visit: Payer: Self-pay

## 2018-07-08 DIAGNOSIS — O368130 Decreased fetal movements, third trimester, not applicable or unspecified: Secondary | ICD-10-CM | POA: Diagnosis not present

## 2018-07-08 DIAGNOSIS — O471 False labor at or after 37 completed weeks of gestation: Principal | ICD-10-CM | POA: Insufficient documentation

## 2018-07-08 DIAGNOSIS — Z3A39 39 weeks gestation of pregnancy: Secondary | ICD-10-CM | POA: Diagnosis not present

## 2018-07-08 NOTE — OB Triage Note (Signed)
Pt G1P0 [redacted]w[redacted]d presents to the ED c/o decreased fetal movement and contractions that started this morning ranging from 15-30 minutes. Denies vaginal bleeding or LOF. External monitors applied, initial FHR 145. VS stable. Provider notified of pt.

## 2018-07-08 NOTE — Discharge Summary (Signed)
  Pt with contractions and decreased fetal movt [redacted] week EGA  NST performed cat 1 - Reactive   cx closed  D/C home

## 2018-07-10 ENCOUNTER — Inpatient Hospital Stay: Payer: Medicaid Other | Admitting: Anesthesiology

## 2018-07-10 ENCOUNTER — Inpatient Hospital Stay
Admission: RE | Admit: 2018-07-10 | Discharge: 2018-07-12 | DRG: 806 | Disposition: A | Discharge status: Home / Self Care | Payer: Medicaid Other | Attending: Obstetrics and Gynecology | Admitting: Obstetrics and Gynecology

## 2018-07-10 ENCOUNTER — Other Ambulatory Visit: Payer: Self-pay

## 2018-07-10 DIAGNOSIS — Z87891 Personal history of nicotine dependence: Secondary | ICD-10-CM

## 2018-07-10 DIAGNOSIS — D62 Acute posthemorrhagic anemia: Secondary | ICD-10-CM | POA: Diagnosis not present

## 2018-07-10 DIAGNOSIS — O9081 Anemia of the puerperium: Secondary | ICD-10-CM | POA: Diagnosis not present

## 2018-07-10 DIAGNOSIS — Z3A40 40 weeks gestation of pregnancy: Secondary | ICD-10-CM | POA: Diagnosis not present

## 2018-07-10 DIAGNOSIS — O26893 Other specified pregnancy related conditions, third trimester: Secondary | ICD-10-CM | POA: Diagnosis present

## 2018-07-10 LAB — CHLAMYDIA/NGC RT PCR (ARMC ONLY)
CHLAMYDIA TR: NOT DETECTED
N gonorrhoeae: NOT DETECTED

## 2018-07-10 LAB — CBC
HCT: 32.4 % — ABNORMAL LOW (ref 36.0–46.0)
HEMOGLOBIN: 10.3 g/dL — AB (ref 12.0–15.0)
MCH: 28.6 pg (ref 26.0–34.0)
MCHC: 31.8 g/dL (ref 30.0–36.0)
MCV: 90 fL (ref 80.0–100.0)
Platelets: 283 10*3/uL (ref 150–400)
RBC: 3.6 MIL/uL — ABNORMAL LOW (ref 3.87–5.11)
RDW: 14.2 % (ref 11.5–15.5)
WBC: 12.5 10*3/uL — ABNORMAL HIGH (ref 4.0–10.5)
nRBC: 0 % (ref 0.0–0.2)

## 2018-07-10 LAB — TYPE AND SCREEN
ABO/RH(D): B POS
ANTIBODY SCREEN: NEGATIVE

## 2018-07-10 MED ORDER — DIPHENHYDRAMINE HCL 25 MG PO CAPS: 25.0000 mg | ORAL_CAPSULE | Freq: Four times a day (QID) | ORAL | Status: DC | PRN

## 2018-07-10 MED ORDER — ACETAMINOPHEN 325 MG PO TABS: 650.0000 mg | ORAL_TABLET | ORAL | Status: DC | PRN

## 2018-07-10 MED ORDER — LIDOCAINE-EPINEPHRINE (PF) 1.5 %-1:200000 IJ SOLN
INTRAMUSCULAR | Status: DC | PRN
  Administered 2018-07-10: 3 mL via EPIDURAL

## 2018-07-10 MED ORDER — FENTANYL 2.5 MCG/ML W/ROPIVACAINE 0.15% IN NS 100 ML EPIDURAL (ARMC)
EPIDURAL | Status: DC | PRN
  Administered 2018-07-10: 12 mL/h via EPIDURAL

## 2018-07-10 MED ORDER — LIDOCAINE HCL (PF) 1 % IJ SOLN: 30.0000 mL | INTRAMUSCULAR | Status: DC | PRN

## 2018-07-10 MED ORDER — ZOLPIDEM TARTRATE 5 MG PO TABS: 5.0000 mg | ORAL_TABLET | Freq: Every evening | ORAL | Status: DC | PRN

## 2018-07-10 MED ORDER — OXYTOCIN BOLUS FROM INFUSION
500.0000 mL | Freq: Once | INTRAVENOUS | Status: AC
  Administered 2018-07-10: 500 mL via INTRAVENOUS

## 2018-07-10 MED ORDER — COCONUT OIL OIL
1.0000 "application " | TOPICAL_OIL | Status: DC | PRN
  Filled 2018-07-10: qty 120

## 2018-07-10 MED ORDER — TERBUTALINE SULFATE 1 MG/ML IJ SOLN: 0.2500 mg | Freq: Once | INTRAMUSCULAR | Status: DC | PRN

## 2018-07-10 MED ORDER — ONDANSETRON HCL 4 MG/2ML IJ SOLN: 4.0000 mg | INTRAMUSCULAR | Status: DC | PRN

## 2018-07-10 MED ORDER — EPHEDRINE 5 MG/ML INJ: 10.0000 mg | INTRAVENOUS | Status: DC | PRN

## 2018-07-10 MED ORDER — OXYTOCIN 40 UNITS IN NORMAL SALINE INFUSION - SIMPLE MED
2.5000 [IU]/h | INTRAVENOUS | Status: DC
  Administered 2018-07-10: 2.5 [IU]/h via INTRAVENOUS
  Filled 2018-07-10: qty 1000

## 2018-07-10 MED ORDER — PRENATAL MULTIVITAMIN CH
1.0000 | ORAL_TABLET | Freq: Every day | ORAL | Status: DC
  Administered 2018-07-11 – 2018-07-12 (×2): 1 via ORAL
  Filled 2018-07-10 (×2): qty 1

## 2018-07-10 MED ORDER — FERROUS SULFATE 325 (65 FE) MG PO TABS
325.0000 mg | ORAL_TABLET | Freq: Two times a day (BID) | ORAL | Status: DC
  Administered 2018-07-11 – 2018-07-12 (×3): 325 mg via ORAL
  Filled 2018-07-10 (×3): qty 1

## 2018-07-10 MED ORDER — LACTATED RINGERS IV SOLN
500.0000 mL | Freq: Once | INTRAVENOUS | Status: AC
  Administered 2018-07-10: 500 mL via INTRAVENOUS

## 2018-07-10 MED ORDER — LIDOCAINE HCL (PF) 1 % IJ SOLN
INTRAMUSCULAR | Status: DC | PRN
  Administered 2018-07-10: 3 mL via SUBCUTANEOUS

## 2018-07-10 MED ORDER — ONDANSETRON HCL 4 MG/2ML IJ SOLN: 4.0000 mg | Freq: Four times a day (QID) | INTRAMUSCULAR | Status: DC | PRN

## 2018-07-10 MED ORDER — DIPHENHYDRAMINE HCL 50 MG/ML IJ SOLN: 12.5000 mg | INTRAMUSCULAR | Status: DC | PRN

## 2018-07-10 MED ORDER — ONDANSETRON HCL 4 MG PO TABS: 4.0000 mg | ORAL_TABLET | ORAL | Status: DC | PRN

## 2018-07-10 MED ORDER — DIBUCAINE 1 % RE OINT: 1.0000 "application " | TOPICAL_OINTMENT | RECTAL | Status: DC | PRN

## 2018-07-10 MED ORDER — WITCH HAZEL-GLYCERIN EX PADS: 1.0000 "application " | MEDICATED_PAD | CUTANEOUS | Status: DC | PRN

## 2018-07-10 MED ORDER — BENZOCAINE-MENTHOL 20-0.5 % EX AERO
1.0000 "application " | INHALATION_SPRAY | CUTANEOUS | Status: DC | PRN
  Administered 2018-07-10: 1 via TOPICAL
  Filled 2018-07-10: qty 56

## 2018-07-10 MED ORDER — SENNOSIDES-DOCUSATE SODIUM 8.6-50 MG PO TABS
2.0000 | ORAL_TABLET | ORAL | Status: DC
  Administered 2018-07-11: 2 via ORAL
  Filled 2018-07-10 (×2): qty 2

## 2018-07-10 MED ORDER — SIMETHICONE 80 MG PO CHEW: 80.0000 mg | CHEWABLE_TABLET | ORAL | Status: DC | PRN

## 2018-07-10 MED ORDER — PHENYLEPHRINE 40 MCG/ML (10ML) SYRINGE FOR IV PUSH (FOR BLOOD PRESSURE SUPPORT): 80.0000 ug | PREFILLED_SYRINGE | INTRAVENOUS | Status: DC | PRN

## 2018-07-10 MED ORDER — BUPIVACAINE HCL (PF) 0.25 % IJ SOLN
INTRAMUSCULAR | Status: DC | PRN
  Administered 2018-07-10: 5 mL via EPIDURAL

## 2018-07-10 MED ORDER — OXYTOCIN 40 UNITS IN NORMAL SALINE INFUSION - SIMPLE MED
1.0000 m[IU]/min | INTRAVENOUS | Status: DC
  Administered 2018-07-10: 2 m[IU]/min via INTRAVENOUS

## 2018-07-10 MED ORDER — LACTATED RINGERS IV SOLN
INTRAVENOUS | Status: DC
  Administered 2018-07-10 (×2): via INTRAVENOUS

## 2018-07-10 MED ORDER — LACTATED RINGERS IV SOLN: 500.0000 mL | INTRAVENOUS | Status: DC | PRN

## 2018-07-10 MED ORDER — IBUPROFEN 600 MG PO TABS
600.0000 mg | ORAL_TABLET | Freq: Four times a day (QID) | ORAL | Status: DC
  Administered 2018-07-10 – 2018-07-12 (×7): 600 mg via ORAL
  Filled 2018-07-10 (×7): qty 1

## 2018-07-10 MED ORDER — SOD CITRATE-CITRIC ACID 500-334 MG/5ML PO SOLN: 30.0000 mL | ORAL | Status: DC | PRN

## 2018-07-10 MED ORDER — FENTANYL 2.5 MCG/ML W/ROPIVACAINE 0.15% IN NS 100 ML EPIDURAL (ARMC)
12.0000 mL/h | EPIDURAL | Status: DC
  Administered 2018-07-10: 12 mL/h via EPIDURAL
  Filled 2018-07-10: qty 100

## 2018-07-10 NOTE — Anesthesia Procedure Notes (Signed)
Epidural Patient location during procedure: OB Start time: 07/10/2018 12:44 PM End time: 07/10/2018 12:56 PM  Staffing Anesthesiologist: Piscitello, Cleda Mccreedy, MD Resident/CRNA: Irving Burton, CRNA Performed: resident/CRNA   Preanesthetic Checklist Completed: patient identified, site marked, surgical consent, pre-op evaluation, IV checked, risks and benefits discussed and monitors and equipment checked  Epidural Patient position: sitting Prep: ChloraPrep and site prepped and draped Patient monitoring: heart rate, continuous pulse ox and blood pressure Approach: midline Location: L3-L4 Injection technique: LOR air  Needle:  Needle type: Tuohy  Needle gauge: 17 G Needle length: 9 cm Needle insertion depth: 5 cm Catheter type: closed end flexible Catheter size: 19 Gauge Catheter at skin depth: 10 cm Test dose: negative and 1.5% lidocaine with Epi 1:200 K  Assessment Events: blood not aspirated, injection not painful, no injection resistance, negative IV test and no paresthesia  Additional Notes Reason for block:procedure for pain

## 2018-07-10 NOTE — Discharge Summary (Signed)
Obstetrical Discharge Summary  Patient Name: Molly Schmidt DOB: [birth year] MRN: [id number]  Date of Admission: 07/10/2018 Date of Delivery: 07/10/18 Delivered by: Heloise Ochoa CNM Date of Discharge: 07/12/2018  Primary OB: [facility]  JME:[id number]'Q last menstrual period was 10/25/2017 (exact date). EDC Estimated Date of Delivery: 07/10/18 Gestational Age at Delivery: [redacted]w[redacted]d   Antepartum complications:  1. Current smoker 2. Anemia, taking iron. 3. Chlamydia x 2, treated 06/30/18 4. Hx anxiety/depression  Admitting Diagnosis: Active labor Secondary Diagnosis: SVD, bilateral labial lacs Patient Active Problem List   Diagnosis Date Noted  . Labor and delivery, indication for care 07/10/2018  . Indication for care in labor and delivery, antepartum 07/08/2018  . Pregnancy 05/26/2018  . Pelvic pressure in pregnancy, antepartum, third trimester 05/03/2018    Augmentation: AROM and Pitocin Complications: None Intrapartum complications/course: Admitted in active labor from office, pain mgmt with epidural, then AROM and Pitocin. Progressed to C/C/+2 and pushed effectively over intact perineum.  Date of Delivery: 07/10/18 Delivered By: Heloise Ochoa CNM Delivery Type: spontaneous vaginal delivery Anesthesia: epidural Placenta: spontaneous Laceration: bilateral labial lacs with repair Episiotomy: none Newborn Data: Live born female  Birth Weight: 6 lb 15.1 oz (3150 g) APGAR: 7, 9  Newborn Delivery   Birth date/time:  07/10/2018 18:57:00 Delivery type:  Vaginal, Spontaneous       Postpartum Procedures: none  Post partum course:  Patient had an uncomplicated postpartum course.  By time of discharge on PPD#2, her pain was controlled on oral pain medications; she had appropriate lochia and was ambulating, voiding without difficulty and tolerating regular diet.  She was deemed stable for discharge to home.    Discharge Physical Exam:  BP 109/66 (BP Location: Left Arm)    Pulse 66   Temp 98.4 F (36.9 C) (Oral)   Resp 18   Ht 5\' 4"  (1.626 m)   Wt 65.5 kg   LMP 10/25/2017 (Exact Date)   SpO2 100%   Breastfeeding Unknown   BMI 24.79 kg/m   General: NAD CV: RRR Pulm: CTABL, nl effort ABD: s/nd/nt, fundus firm and below the umbilicus Lochia: scant Perineum: well approximated/intact DVT Evaluation: LE non-ttp, no evidence of DVT on exam.  Hemoglobin  Date Value Ref Range Status  07/11/2018 8.7 (L) 12.0 - 15.0 g/dL Final   HCT  Date Value Ref Range Status  07/11/2018 27.0 (L) 36.0 - 46.0 % Final     Disposition: stable, discharge to home. Baby Feeding: breastmilk and formula Baby Disposition: home with mom  Rh Immune globulin given: n/a Rubella vaccine given: n/a Varicella vaccine given: n/a Tdap vaccine given in AP setting: given at her job during pregnancy Flu vaccine given in AP setting: 03/24/18  Contraception: Depo prior to Dc  Prenatal Labs:    Blood type/Rh  B Pos  Antibody screen neg  Rubella Immune  Varicella Immune  RPR NR  HBsAg Neg  HIV NR  GC neg  Chlamydia neg  Genetic screening negative  1 hour GTT  75  GBS  negative      Plan:  Molly Schmidt was discharged to home in good condition. Follow-up appointment with delivering provider in 6 weeks.  Discharge Medications: Allergies as of 07/12/2018   No Known Allergies     Medication List    TAKE these medications   acetaminophen 325 MG tablet Commonly known as:  TYLENOL Take 2 tablets (650 mg total) by mouth every 4 (four) hours as needed (for pain scale < 4  OR  temperature  >/=  100.5 F).   albuterol 108 (90 Base) MCG/ACT inhaler Commonly known as:  PROVENTIL HFA;VENTOLIN HFA Inhale 2 puffs into the lungs every 6 (six) hours as needed for wheezing or shortness of breath.   ferrous sulfate 325 (65 FE) MG tablet Take 1 tablet (325 mg total) by mouth 2 (two) times daily with a meal.   ibuprofen 600 MG tablet Commonly known as:  ADVIL,MOTRIN Take 1  tablet (600 mg total) by mouth every 6 (six) hours as needed for moderate pain or cramping.   prenatal multivitamin Tabs tablet Take 1 tablet by mouth daily at 12 noon.   senna-docusate 8.6-50 MG tablet Commonly known as:  Senokot-S Take 2 tablets by mouth at bedtime as needed for mild constipation.   vitamin C 250 MG tablet Commonly known as:  ASCORBIC ACID Take 1 tablet (250 mg total) by mouth 2 (two) times daily. With iron supplement       Follow-up Information    McVey, Prudencio Pair, CNM Follow up in 6 week(s).   Specialty:  Obstetrics and Gynecology Contact information: [address] [place] [address] [phone]           Signed:  Randa Ngo, CNM 07/12/2018  9:52 AM

## 2018-07-10 NOTE — Progress Notes (Signed)
Labor Progress Note  Molly Schmidt is a 23 y.o. G1P0 at [redacted]w[redacted]d by ultrasound admitted for active labor  Subjective: comfortable with epidural.   Objective: BP 108/75 (BP Location: Left Arm)   Pulse (!) 127   Temp 97.7 F (36.5 C) (Oral)   Resp 18   Ht 5\' 4"  (1.626 m)   Wt 65.5 kg   LMP 10/25/2017 (Exact Date)   BMI 24.79 kg/m  Notable VS details: reviewed, normotensive.   Fetal Assessment: FHT:  FHR: 130 bpm, variability: moderate,  accelerations:  Present,  decelerations:  Absent, some early decels since AROM.  Category/reactivity:  Category I UC:   regular, every 6-8 minutes SVE:  9cm with BBOW noted,  AROM performed, mod amt clear fluid, cx 8cm/80/-1, with head well applied.  Membrane status: AROM at 1530 Amniotic color: clear  Labs: Lab Results  Component Value Date   WBC 12.5 (H) 07/10/2018   HGB 10.3 (L) 07/10/2018   HCT 32.4 (L) 07/10/2018   MCV 90.0 07/10/2018   PLT 283 07/10/2018    Assessment / Plan: G1 at 40+0wks, active labor  Labor: s/p AROM, UCs irregular, will start Pitocin.  Preeclampsia:  no e/o Pre-e Fetal Wellbeing:  Category I Pain Control:  Epidural I/D:  GBS neg, repeat TOC GC/CT negative on admission.  Anticipated MOD:  NSVD  Randa Ngo, CNM 07/10/2018, 4:18 PM

## 2018-07-10 NOTE — Anesthesia Preprocedure Evaluation (Signed)
Anesthesia Evaluation  Patient identified by MRN, date of birth, ID band Patient awake    Reviewed: Allergy & Precautions, H&P , NPO status , Patient's Chart, lab work & pertinent test results  History of Anesthesia Complications Negative for: history of anesthetic complications  Airway Mallampati: II       Dental no notable dental hx. (+) Teeth Intact   Pulmonary former smoker,           Cardiovascular      Neuro/Psych    GI/Hepatic   Endo/Other    Renal/GU      Musculoskeletal   Abdominal   Peds  Hematology   Anesthesia Other Findings   Reproductive/Obstetrics (+) Pregnancy                             Anesthesia Physical Anesthesia Plan  ASA: II  Anesthesia Plan: Epidural   Post-op Pain Management:    Induction:   PONV Risk Score and Plan:   Airway Management Planned:   Additional Equipment:   Intra-op Plan:   Post-operative Plan:   Informed Consent: I have reviewed the patients History and Physical, chart, labs and discussed the procedure including the risks, benefits and alternatives for the proposed anesthesia with the patient or authorized representative who has indicated his/her understanding and acceptance.       Plan Discussed with: Anesthesiologist  Anesthesia Plan Comments:         Anesthesia Quick Evaluation

## 2018-07-10 NOTE — Progress Notes (Addendum)
Labor Progress Note  Molly Schmidt is a 23 y.o. G1P0 at [redacted]w[redacted]d by ultrasound admitted for active labor  Subjective: comfortable with epidural, feeling a little pressure.    Objective: BP 108/72 (BP Location: Left Arm)   Pulse 84   Temp 98.8 F (37.1 C) (Oral)   Resp 18   Ht 5\' 4"  (1.626 m)   Wt 65.5 kg   LMP 10/25/2017 (Exact Date)   BMI 24.79 kg/m    Notable VS details: reviewed, normotensive.   Fetal Assessment: FHT:  FHR: 130 bpm, variability: moderate,  accelerations:  Present,  decelerations:  absent Category/reactivity:  Category I UC:   regular, q76min, Pitocin at 1mu/min SVE: C/C/+2 Membrane status: AROM at 1530 Amniotic color: clear  Labs: RecentLabs       Lab Results  Component Value Date   WBC 12.5 (H) 07/10/2018   HGB 10.3 (L) 07/10/2018   HCT 32.4 (L) 07/10/2018   MCV 90.0 07/10/2018   PLT 283 07/10/2018      Assessment / Plan: G1 at 40+0wks, active labor  Labor: s/p AROM, UC pattern with Pitocin, now 2nd stage.  Preeclampsia:  no e/o Pre-e Fetal Wellbeing:  Category I Pain Control:  Epidural I/D:  GBS neg, repeat TOC GC/CT negative on admission.  Anticipated MOD:  NSVD  Randa Ngo, CNM 07/10/2018 6:39 PM

## 2018-07-10 NOTE — H&P (Signed)
OB History & Physical   History of Present Illness:  Chief Complaint: Contractions all night  HPI:  BILL BONVILLE is a 23 y.o. G1P0 female at [redacted]w[redacted]d dated by [place] at [redacted]w[redacted]d.  She presents to L&D for painful UCs since last night. Reports + FM, denies LOF or VB, but having some blood tinged mucus. Cervix checked in office and noted to be 9cm with BBOW.   Pregnancy Issues: 1. Current smoker 2. Anemia, taking iron. 3. Chlamydia x 2, treated 06/30/18 4. Hx anxiety/depression   Maternal Medical History:   Past Medical History:  Diagnosis Date  . Environmental and seasonal allergies     History reviewed. No pertinent surgical history.  No Known Allergies  Prior to Admission medications   Medication Sig Start Date End Date Taking? Authorizing Provider  acetaminophen (TYLENOL) 325 MG tablet Take 2 tablets (650 mg total) by mouth every 4 (four) hours as needed (for pain scale < 4  OR  temperature  >/=  100.5 F). Patient not taking: Reported on 07/08/2018 05/03/18   Javarian Jakubiak, Prudencio Pair, CNM  albuterol (PROVENTIL HFA;VENTOLIN HFA) 108 (90 Base) MCG/ACT inhaler Inhale 2 puffs into the lungs every 6 (six) hours as needed for wheezing or shortness of breath. Patient not taking: Reported on 07/08/2018 01/19/18   Governor Rooks, MD  ondansetron (ZOFRAN ODT) 4 MG disintegrating tablet Take 1 tablet (4 mg total) by mouth every 6 (six) hours as needed for nausea or vomiting. Patient not taking: Reported on 05/25/2018 10/05/15   Menshew, Charlesetta Ivory, PA-C  Prenatal Vit-Fe Fumarate-FA (PRENATAL MULTIVITAMIN) TABS tablet Take 1 tablet by mouth daily at 12 noon.    [provider]     Prenatal care site: [facility]    Social History: She  reports that she quit smoking about 3 months ago. Her smoking use included cigarettes. She has never used smokeless tobacco. She reports that she does not drink alcohol or use drugs.  Family History: no fam hx Gyn cancers; mother and MGM with diabetes and  HTN  Review of Systems: A full review of systems was performed and negative except as noted in the HPI.     Physical Exam:  Vital Signs: BP 114/76 (BP Location: Left Arm)   Pulse 96   Temp 97.7 F (36.5 C) (Oral)   Resp 18   Ht 5\' 4"  (1.626 m)   Wt 65.5 kg   LMP 10/25/2017 (Exact Date)   BMI 24.79 kg/m  General: no acute distress.  HEENT: normocephalic, atraumatic Heart: regular rate & rhythm.  No murmurs/rubs/gallops Lungs: clear to auscultation bilaterally, normal respiratory effort Abdomen: soft, gravid, non-tender;  EFW: 6.5lbs Pelvic:   External: Normal external female genitalia  Cervix: Dilation: 9 /   /      Extremities: non-tender, symmetric, no edema bilaterally.  DTRs: 2+  Neurologic: Alert & oriented x 3.    Results for orders placed or performed during the hospital encounter of 07/10/18 (from the past 24 hour(s))  CBC     Status: Abnormal   Collection Time: 07/10/18 11:36 AM  Result Value Ref Range   WBC 12.5 (H) 4.0 - 10.5 K/uL   RBC 3.60 (L) 3.87 - 5.11 MIL/uL   Hemoglobin 10.3 (L) 12.0 - 15.0 g/dL   HCT 16.1 (L) 09.6 - 04.5 %   MCV 90.0 80.0 - 100.0 fL   MCH 28.6 26.0 - 34.0 pg   MCHC 31.8 30.0 - 36.0 g/dL   RDW 40.9 81.1 -  15.5 %   Platelets 283 150 - 400 K/uL   nRBC 0.0 0.0 - 0.2 %    Pertinent Results:  Prenatal Labs: Blood type/Rh  B Pos  Antibody screen neg  Rubella Immune  Varicella Immune  RPR NR  HBsAg Neg  HIV NR  GC neg  Chlamydia neg  Genetic screening negative  1 hour GTT  75  GBS  negative   FHT:135bpm, mod variability, + accels, no decels TOCO:q6-82min SVE:  Dilation: 9 /   /      Cephalic by leopolds  No results found.  Assessment:  MARYORI STAMOUR is a 23 y.o. G1P0 female at [redacted]w[redacted]d with active labor.   Plan:  1. Admit to Labor & Delivery; consents reviewed and obtained - recent chlamydia infection, s/p treatment 10days ago, Discussed with Neonatologist on call and Dr Elesa Massed, no further indication for treatment or  changes in labor mgmt.   2. Fetal Well being  - Fetal Tracing: Cat I - Group B Streptococcus ppx indicated: negative - Presentation: cephalic confirmed by exam   3. Routine OB: - Prenatal labs reviewed, as above - Rh B pos - CBC, T&S, RPR on admit - Clear fluids, IVF  4. Monitoring of Labor- -  Contractions: external toco in place -  Pelvis unproven, adequate for TOL  -  Plan for augmentation with AROM prn.  -  Plan for continuous fetal monitoring  -  Maternal pain control as desired; requesting regional anesthesia - Anticipate vaginal delivery   Randa Ngo, CNM 07/10/18 12:36 PM

## 2018-07-11 LAB — RPR: RPR Ser Ql: NONREACTIVE

## 2018-07-11 LAB — CBC
HCT: 27 % — ABNORMAL LOW (ref 36.0–46.0)
Hemoglobin: 8.7 g/dL — ABNORMAL LOW (ref 12.0–15.0)
MCH: 29 pg (ref 26.0–34.0)
MCHC: 32.2 g/dL (ref 30.0–36.0)
MCV: 90 fL (ref 80.0–100.0)
Platelets: 199 10*3/uL (ref 150–400)
RBC: 3 MIL/uL — ABNORMAL LOW (ref 3.87–5.11)
RDW: 14 % (ref 11.5–15.5)
WBC: 11.4 10*3/uL — AB (ref 4.0–10.5)
nRBC: 0 % (ref 0.0–0.2)

## 2018-07-11 NOTE — Plan of Care (Signed)
Patient's vital signs stable; fundus firm; small amount rubra lochia; voiding; good appetite; good po fluids; breast and bottle feeding infant; good maternal-infant bonding observed; husband at bedside and attentive; pain controlled with po motrin.

## 2018-07-11 NOTE — Progress Notes (Signed)
Post Partum Day 1 Subjective: Doing well, no complaints.  Tolerating regular diet, pain with PO meds, voiding and ambulating without difficulty. Having some trouble with breastfeeding on left side mainly.   No CP SOB Fever,Chills, N/V or leg pain; denies nipple or breast pain; no HA change of vision, RUQ/epigastric pain  Objective: BP 93/69 (BP Location: Left Arm)   Pulse 72   Temp 98.1 F (36.7 C) (Axillary)   Resp 18   Ht 5\' 4"  (1.626 m)   Wt 65.5 kg   LMP 10/25/2017 (Exact Date)   SpO2 100%   Breastfeeding Unknown   BMI 24.79 kg/m    Physical Exam:  General: NAD Breasts: soft/nontender CV: RRR Pulm: nl effort, CTABL Abdomen: soft, NT, BS x 4 Perineum: minimal edema, labial laceration repair well approximated Lochia: small Uterine Fundus: fundus firm and 2 fb below umbilicus DVT Evaluation: no cords, ttp LEs   Recent Labs    07/10/18 1136 07/11/18 0617  HGB 10.3* 8.7*  HCT 32.4* 27.0*  WBC 12.5* 11.4*  PLT 283 199    Assessment/Plan: 22 y.o. G1P1001 postpartum day # 1  - Continue routine PP care - Lactation consult prn  - Discussed contraceptive options, desires Depo - Acute blood loss anemia - hemodynamically stable and asymptomatic; start po ferrous sulfate BID with stool softeners  - Immunization status: all Imms up to date    Disposition: Does not desire Dc home today.     Randa Ngo, CNM 07/11/2018  8:53 AM

## 2018-07-11 NOTE — Anesthesia Postprocedure Evaluation (Signed)
Anesthesia Post Note  Patient: Molly Schmidt  Procedure(s) Performed: AN AD HOC LABOR EPIDURAL  Anesthesia Type: Epidural Postop Assessment: able to ambulate, epidural receding, no backache and no headache     Last Vitals:  Vitals:   07/11/18 0502 07/11/18 0754  BP: (!) 92/55 93/69  Pulse: 83 72  Resp: 18 18  Temp: 36.9 C 36.7 C  SpO2: 98% 100%    Last Pain:  Vitals:   07/11/18 0815  TempSrc:   PainSc: 0-No pain                 Jovita Gamma

## 2018-07-12 MED ORDER — MEDROXYPROGESTERONE ACETATE 150 MG/ML IM SUSP
150.0000 mg | INTRAMUSCULAR | Status: DC
  Administered 2018-07-12: 150 mg via INTRAMUSCULAR
  Filled 2018-07-12: qty 1

## 2018-07-12 MED ORDER — SENNOSIDES-DOCUSATE SODIUM 8.6-50 MG PO TABS: 2.0000 | ORAL_TABLET | Freq: Every evening | ORAL | 0 refills | Status: DC | PRN

## 2018-07-12 MED ORDER — VITAMIN C 250 MG PO TABS: 250.0000 mg | ORAL_TABLET | Freq: Two times a day (BID) | ORAL | 0 refills | Status: AC

## 2018-07-12 MED ORDER — IBUPROFEN 600 MG PO TABS: 600.0000 mg | ORAL_TABLET | Freq: Four times a day (QID) | ORAL | 0 refills | Status: DC | PRN

## 2018-07-12 MED ORDER — FERROUS SULFATE 325 (65 FE) MG PO TABS: 325.0000 mg | ORAL_TABLET | Freq: Two times a day (BID) | ORAL | 0 refills | Status: DC

## 2018-07-12 NOTE — Progress Notes (Signed)
Post Partum Day 2 Subjective: Doing well, no complaints.  Tolerating regular diet, pain with PO meds, voiding and ambulating without difficulty. Still having trouble with latch, plans to contact the [facility] office tomorrow and see the breastfeeding counselor.   No CP SOB Fever,Chills, N/V or leg pain; denies nipple or breast pain; no HA change of vision, RUQ/epigastric pain  Objective: BP 109/66 (BP Location: Left Arm)   Pulse 66   Temp 98.4 F (36.9 C) (Oral)   Resp 18   Ht 5\' 4"  (1.626 m)   Wt 65.5 kg   LMP 10/25/2017 (Exact Date)   SpO2 100%   Breastfeeding Unknown   BMI 24.79 kg/m     Physical Exam:  General: NAD Breasts: soft/nontender CV: RRR Pulm: nl effort, CTABL Abdomen: soft, NT, BS x 4 Perineum: minimal edema, labial laceration repair well approximated Lochia: small Uterine Fundus: fundus firm and 3 fb below umbilicus DVT Evaluation: no cords, ttp LEs   RecentLabs(last2labs)  Recent Labs    07/10/18 1136 07/11/18 0617  HGB 10.3* 8.7*  HCT 32.4* 27.0*  WBC 12.5* 11.4*  PLT 283 199      Assessment/Plan: 23 y.o. G1P1001 postpartum day # 2  - Continue routine PP care - Lactation consult prn  - Discussed contraceptive options, desires Depo prior to DC - Acute blood loss anemia - continue iron and vit C supplement - Immunization status: all Imms up to date    Disposition: Does desire Dc home today.   Molly Schmidt, CNM 07/12/2018 9:43 AM

## 2018-07-12 NOTE — Progress Notes (Signed)
Discharge instructions given. Patient verbalizes understanding of teaching. Patient discharged home via wheelchair at 1420. 

## 2018-07-12 NOTE — Progress Notes (Signed)
Father of the baby, Molly Schmidt, requesting work excuse. RN explained to patient and FOB that patient's information would be on the note, as it will be printed from her patient chart. Both verbalize understanding. Patient ok with FOB receiving work excuse. Note given.

## 2019-04-14 ENCOUNTER — Other Ambulatory Visit: Payer: Self-pay

## 2019-04-14 ENCOUNTER — Emergency Department
Admission: EM | Admit: 2019-04-14 | Discharge: 2019-04-14 | Disposition: A | Discharge status: Home / Self Care | Payer: Medicaid Other | Attending: Emergency Medicine | Admitting: Emergency Medicine

## 2019-04-14 ENCOUNTER — Encounter: Payer: Self-pay | Admitting: Emergency Medicine

## 2019-04-14 ENCOUNTER — Emergency Department: Payer: Medicaid Other

## 2019-04-14 DIAGNOSIS — Z79899 Other long term (current) drug therapy: Secondary | ICD-10-CM | POA: Diagnosis not present

## 2019-04-14 DIAGNOSIS — Z20828 Contact with and (suspected) exposure to other viral communicable diseases: Secondary | ICD-10-CM | POA: Insufficient documentation

## 2019-04-14 DIAGNOSIS — J069 Acute upper respiratory infection, unspecified: Secondary | ICD-10-CM | POA: Diagnosis not present

## 2019-04-14 DIAGNOSIS — R0602 Shortness of breath: Secondary | ICD-10-CM | POA: Insufficient documentation

## 2019-04-14 DIAGNOSIS — Z87891 Personal history of nicotine dependence: Secondary | ICD-10-CM | POA: Diagnosis not present

## 2019-04-14 DIAGNOSIS — R05 Cough: Secondary | ICD-10-CM | POA: Insufficient documentation

## 2019-04-14 DIAGNOSIS — R0981 Nasal congestion: Secondary | ICD-10-CM | POA: Insufficient documentation

## 2019-04-14 DIAGNOSIS — R509 Fever, unspecified: Secondary | ICD-10-CM | POA: Diagnosis present

## 2019-04-14 LAB — GROUP A STREP BY PCR: Group A Strep by PCR: NOT DETECTED

## 2019-04-14 LAB — SARS CORONAVIRUS 2 (TAT 6-24 HRS): SARS Coronavirus 2: NEGATIVE

## 2019-04-14 MED ORDER — IBUPROFEN 600 MG PO TABS: 600.0000 mg | ORAL_TABLET | Freq: Three times a day (TID) | ORAL | 0 refills | Status: DC | PRN

## 2019-04-14 MED ORDER — BENZONATATE 100 MG PO CAPS: 200.0000 mg | ORAL_CAPSULE | Freq: Three times a day (TID) | ORAL | 0 refills | Status: DC | PRN

## 2019-04-14 MED ORDER — FEXOFENADINE-PSEUDOEPHED ER 60-120 MG PO TB12: 1.0000 | ORAL_TABLET | Freq: Two times a day (BID) | ORAL | 0 refills | Status: DC

## 2019-04-14 NOTE — ED Triage Notes (Signed)
Pt in via POV, reports fever, congestion, shortness of breath since Sunday.  Ambulatory to triage, NAD noted at this time.

## 2019-04-14 NOTE — Discharge Instructions (Signed)
Advised self quarantine pending results of COVID-19 test. °

## 2019-04-14 NOTE — ED Provider Notes (Signed)
[facility] Emergency Department Provider Note   ____________________________________________   First MD Initiated Contact with Patient 04/14/19 1146     (approximate)  I have reviewed the triage vital signs and the nursing notes.   HISTORY  Chief Complaint URI    HPI Molly Schmidt is a 23 y.o. female patient complain of 3 days of fever, nasal congestion, shortness of breath, and cough.  Patient denies recent travel or known exposure to COVID-19.  Patient denies nausea, vomiting, diarrhea.  Patient rates her pain 6/10.  Patient scribed pain is "achy".  No palliative measure for complaint.         Past Medical History:  Diagnosis Date  . Environmental and seasonal allergies     Patient Active Problem List   Diagnosis Date Noted  . Labor and delivery, indication for care 07/10/2018  . Indication for care in labor and delivery, antepartum 07/08/2018  . Pregnancy 05/26/2018  . Pelvic pressure in pregnancy, antepartum, third trimester 05/03/2018    History reviewed. No pertinent surgical history.  Prior to Admission medications   Medication Sig Start Date End Date Taking? Authorizing Provider  acetaminophen (TYLENOL) 325 MG tablet Take 2 tablets (650 mg total) by mouth every 4 (four) hours as needed (for pain scale < 4  OR  temperature  >/=  100.5 F). 05/03/18   McVey, Prudencio Pair, CNM  albuterol (PROVENTIL HFA;VENTOLIN HFA) 108 (90 Base) MCG/ACT inhaler Inhale 2 puffs into the lungs every 6 (six) hours as needed for wheezing or shortness of breath. Patient not taking: Reported on 07/08/2018 01/19/18   Governor Rooks, MD  benzonatate (TESSALON PERLES) 100 MG capsule Take 2 capsules (200 mg total) by mouth 3 (three) times daily as needed. 04/14/19 04/13/20  Joni Reining, PA-C  ferrous sulfate 325 (65 FE) MG tablet Take 1 tablet (325 mg total) by mouth 2 (two) times daily with a meal. 07/12/18   McVey, Prudencio Pair, CNM  fexofenadine-pseudoephedrine  (ALLEGRA-D) 60-120 MG 12 hr tablet Take 1 tablet by mouth 2 (two) times daily. 04/14/19   Joni Reining, PA-C  ibuprofen (ADVIL) 600 MG tablet Take 1 tablet (600 mg total) by mouth every 8 (eight) hours as needed. 04/14/19   Joni Reining, PA-C  ibuprofen (ADVIL,MOTRIN) 600 MG tablet Take 1 tablet (600 mg total) by mouth every 6 (six) hours as needed for moderate pain or cramping. 07/12/18   McVey, Prudencio Pair, CNM  Prenatal Vit-Fe Fumarate-FA (PRENATAL MULTIVITAMIN) TABS tablet Take 1 tablet by mouth daily at 12 noon.    [provider]  senna-docusate (SENOKOT-S) 8.6-50 MG tablet Take 2 tablets by mouth at bedtime as needed for mild constipation. 07/12/18   McVey, Prudencio Pair, CNM  vitamin C (ASCORBIC ACID) 250 MG tablet Take 1 tablet (250 mg total) by mouth 2 (two) times daily. With iron supplement 07/12/18   McVey, Prudencio Pair, CNM    Allergies Patient has no known allergies.  No family history on file.  Social History Social History   Tobacco Use  . Smoking status: Former Smoker    Types: Cigarettes    Quit date: 04/09/2018    Years since quitting: 1.0  . Smokeless tobacco: Never Used  Substance Use Topics  . Alcohol use: No  . Drug use: No    Review of Systems Constitutional: Fever.   Eyes: No visual changes. ENT: No sore throat.  Nasal congestion. Cardiovascular: Denies chest pain.  Nonproductive cough. Respiratory:  shortness of breath.  Gastrointestinal: No abdominal pain.  No nausea, no vomiting.  No diarrhea.  No constipation. Genitourinary: Negative for dysuria. Musculoskeletal: Negative for back pain. Skin: Negative for rash. Neurological: Negative for headaches, focal weakness or numbness.   ____________________________________________   PHYSICAL EXAM:  VITAL SIGNS: ED Triage Vitals  Enc Vitals Group     BP 04/14/19 1135 116/67     Pulse Rate 04/14/19 1135 (!) 101     Resp 04/14/19 1135 16     Temp 04/14/19 1135 98.4 F (36.9 C)     Temp Source  04/14/19 1135 Oral     SpO2 04/14/19 1135 97 %     Weight 04/14/19 1128 135 lb (61.2 kg)     Height 04/14/19 1128 5\' 5"  (1.651 m)     Head Circumference --      Peak Flow --      Pain Score 04/14/19 1128 6     Pain Loc --      Pain Edu? --      Excl. in GC? --    Constitutional: Alert and oriented. Well appearing and in no acute distress. Nose: Edematous nasal turbinates clear rhinorrhea. Mouth/Throat: Mucous membranes are moist.  Oropharynx non-erythematous.  Postnasal drainage. Neck: No stridor.  Hematological/Lymphatic/Immunilogical: No cervical lymphadenopathy. Cardiovascular: Normal rate, regular rhythm. Grossly normal heart sounds.  Good peripheral circulation. Respiratory: Normal respiratory effort.  No retractions. Lungs CTAB. Gastrointestinal: Soft and nontender. No distention. No abdominal bruits. No CVA tenderness. Genitourinary: Deferred Skin:  Skin is warm, dry and intact. No rash noted. Psychiatric: Mood and affect are normal. Speech and behavior are normal.  ____________________________________________   LABS (all labs ordered are listed, but only abnormal results are displayed)  Labs Reviewed  GROUP A STREP BY PCR  SARS CORONAVIRUS 2 (TAT 6-24 HRS)   ____________________________________________  EKG   ____________________________________________  RADIOLOGY  ED MD interpretation:    Official radiology report(s): Dg Chest Portable 1 View  Result Date: 04/14/2019 CLINICAL DATA:  Cough and shortness of breath.  Fever. EXAM: PORTABLE CHEST 1 VIEW COMPARISON:  July 25, 2014 FINDINGS: Lungs are clear. Heart size and pulmonary vascularity are normal. No adenopathy. No bone lesions. IMPRESSION: No edema or consolidation.  No evident adenopathy. Electronically Signed   By: Bretta BangWilliam  Woodruff III M.D.   On: 04/14/2019 12:27    ____________________________________________   PROCEDURES  Procedure(s) performed (including Critical Care):  Procedures    ____________________________________________   INITIAL IMPRESSION / ASSESSMENT AND PLAN / ED COURSE  As part of my medical decision making, I reviewed the following data within the electronic MEDICAL RECORD NUMBER      Patient presents with 3 days of fever, congestion, shortness of breath.  Discussed neck x-ray findings with patient.  Patient physical exam consistent with viral infection.  Patient given discharge care instruction advised take medication as directed.  Patient advised to self quarantine pending results of COVID-19 test.   Jannett Celestineandi D Dimitroff was evaluated in Emergency Department on 04/14/2019 for the symptoms described in the history of present illness. She was evaluated in the context of the global COVID-19 pandemic, which necessitated consideration that the patient might be at risk for infection with the SARS-CoV-2 virus that causes COVID-19. Institutional protocols and algorithms that pertain to the evaluation of patients at risk for COVID-19 are in a state of rapid change based on information released by regulatory bodies including the CDC and federal and state organizations. These policies and algorithms were followed during the patient's care in  the ED.       ____________________________________________   FINAL CLINICAL IMPRESSION(S) / ED DIAGNOSES  Final diagnoses:  Viral URI with cough     ED Discharge Orders         Ordered    fexofenadine-pseudoephedrine (ALLEGRA-D) 60-120 MG 12 hr tablet  2 times daily     04/14/19 1320    benzonatate (TESSALON PERLES) 100 MG capsule  3 times daily PRN     04/14/19 1320    ibuprofen (ADVIL) 600 MG tablet  Every 8 hours PRN     04/14/19 1320           Note:  This document was prepared using Dragon voice recognition software and may include unintentional dictation errors.    Sable Feil, PA-C 04/14/19 1326    Earleen Newport, MD 04/14/19 1400

## 2019-04-14 NOTE — ED Notes (Signed)
Fever, cough for a few days. See triage note.

## 2019-06-17 ENCOUNTER — Emergency Department: Payer: Medicaid Other

## 2019-06-17 ENCOUNTER — Encounter: Payer: Self-pay | Admitting: Emergency Medicine

## 2019-06-17 ENCOUNTER — Emergency Department
Admission: EM | Admit: 2019-06-17 | Discharge: 2019-06-17 | Disposition: A | Discharge status: Home / Self Care | Payer: Medicaid Other | Attending: Emergency Medicine | Admitting: Emergency Medicine

## 2019-06-17 ENCOUNTER — Other Ambulatory Visit: Payer: Self-pay

## 2019-06-17 DIAGNOSIS — R05 Cough: Secondary | ICD-10-CM | POA: Insufficient documentation

## 2019-06-17 DIAGNOSIS — Z20822 Contact with and (suspected) exposure to covid-19: Secondary | ICD-10-CM | POA: Insufficient documentation

## 2019-06-17 DIAGNOSIS — Z87891 Personal history of nicotine dependence: Secondary | ICD-10-CM | POA: Insufficient documentation

## 2019-06-17 DIAGNOSIS — Z79899 Other long term (current) drug therapy: Secondary | ICD-10-CM | POA: Diagnosis not present

## 2019-06-17 LAB — SARS CORONAVIRUS 2 (TAT 6-24 HRS): SARS Coronavirus 2: NEGATIVE

## 2019-06-17 NOTE — ED Triage Notes (Signed)
Pt reports upper resp symptoms for the past 3 days. Pt c/o sneezing attacks, cough, congestion, nasal drainage and general bodyaches.

## 2019-06-17 NOTE — ED Provider Notes (Signed)
[facility] Emergency Department Provider Note  ____________________________________________   First MD Initiated Contact with Patient 06/17/19 1340     (approximate)  I have reviewed the triage vital signs and the nursing notes.   HISTORY  Chief Complaint Cough, Nasal Congestion, Generalized Body Aches, and sneezing    HPI Molly Schmidt is a 24 y.o. female presents emergency department complaining of body aches, runny nose, congestion, some abdominal pain and nausea.  Boyfriend has the same symptoms.  She is unsure if she had a fever as she did not check it.  She denies any dysuria or vaginal discharge.    Past Medical History:  Diagnosis Date  . Environmental and seasonal allergies     Patient Active Problem List   Diagnosis Date Noted  . Labor and delivery, indication for care 07/10/2018  . Indication for care in labor and delivery, antepartum 07/08/2018  . Pregnancy 05/26/2018  . Pelvic pressure in pregnancy, antepartum, third trimester 05/03/2018    History reviewed. No pertinent surgical history.  Prior to Admission medications   Medication Sig Start Date End Date Taking? Authorizing Provider  acetaminophen (TYLENOL) 325 MG tablet Take 2 tablets (650 mg total) by mouth every 4 (four) hours as needed (for pain scale < 4  OR  temperature  >/=  100.5 F). 05/03/18   McVey, Murray Hodgkins, CNM  ferrous sulfate 325 (65 FE) MG tablet Take 1 tablet (325 mg total) by mouth 2 (two) times daily with a meal. 07/12/18   McVey, Murray Hodgkins, CNM  fexofenadine-pseudoephedrine (ALLEGRA-D) 60-120 MG 12 hr tablet Take 1 tablet by mouth 2 (two) times daily. 04/14/19   Sable Feil, PA-C  senna-docusate (SENOKOT-S) 8.6-50 MG tablet Take 2 tablets by mouth at bedtime as needed for mild constipation. 07/12/18   McVey, Murray Hodgkins, CNM  vitamin C (ASCORBIC ACID) 250 MG tablet Take 1 tablet (250 mg total) by mouth 2 (two) times daily. With iron supplement 07/12/18   McVey,  Murray Hodgkins, CNM    Allergies Patient has no known allergies.  No family history on file.  Social History Social History   Tobacco Use  . Smoking status: Former Smoker    Types: Cigarettes    Quit date: 04/09/2018    Years since quitting: 1.1  . Smokeless tobacco: Never Used  Substance Use Topics  . Alcohol use: No  . Drug use: No    Review of Systems  Constitutional: Unsure fever/chills Eyes: No visual changes. ENT: No sore throat. Respiratory: Positive cough Cardiovascular: Denies chest pain Gastrointestinal: Positive abdominal pain due to cramping like nausea/vomiting Genitourinary: Negative for dysuria. Musculoskeletal: Negative for back pain. Skin: Negative for rash. Psychiatric: no mood changes,     ____________________________________________   PHYSICAL EXAM:  VITAL SIGNS: ED Triage Vitals  Enc Vitals Group     BP 06/17/19 1341 122/84     Pulse Rate 06/17/19 1341 (!) 104     Resp 06/17/19 1341 16     Temp 06/17/19 1341 98.1 F (36.7 C)     Temp Source 06/17/19 1341 Oral     SpO2 06/17/19 1341 99 %     Weight 06/17/19 1332 135 lb (61.2 kg)     Height 06/17/19 1332 5\' 5"  (1.651 m)     Head Circumference --      Peak Flow --      Pain Score 06/17/19 1332 6     Pain Loc --      Pain Edu? --  Excl. in GC? --     Constitutional: Alert and oriented. Well appearing and in no acute distress. Eyes: Conjunctivae are normal.  Head: Atraumatic. Nose: No congestion/rhinnorhea. Mouth/Throat: Mucous membranes are moist.   Neck:  supple no lymphadenopathy noted Cardiovascular: Normal rate, regular rhythm. Heart sounds are normal Respiratory: Normal respiratory effort.  No retractions, lungs c t a  Abd: soft minimally tender in all 4 quadrants, bs normal all 4 quad GU: deferred Musculoskeletal: FROM all extremities, warm and well perfused Neurologic:  Normal speech and language.  Skin:  Skin is warm, dry and intact. No rash noted. Psychiatric: Mood  and affect are normal. Speech and behavior are normal.  ____________________________________________   LABS (all labs ordered are listed, but only abnormal results are displayed)  Labs Reviewed  SARS CORONAVIRUS 2 (TAT 6-24 HRS)   ____________________________________________   ____________________________________________  RADIOLOGY  Chest x-ray is normal  ____________________________________________   PROCEDURES  Procedure(s) performed: No  Procedures    ____________________________________________   INITIAL IMPRESSION / ASSESSMENT AND PLAN / ED COURSE  Pertinent labs & imaging results that were available during my care of the patient were reviewed by me and considered in my medical decision making (see chart for details).   Patient is 24 year old female presents emergency department with Covid-like symptoms.  See HPI  Physical exam shows patient to appear well.  Exam is basically unremarkable except for a mild abdominal tenderness.  Chest x-ray Covid swab.  The patient looks very well.  Not feel that she needs lab work or abdominal imaging.  Feel this is most likely Covid.  Chest x-ray is normal.  Insert explained to the patient.  She is given a Covid test.  She is given a work note stating that she has suspected Covid.  If negative she is to return to her normal activities.  If positive she should remain quarantined for 10 to 14 days.  She states she understands will comply.  She is discharged stable condition.  Return if worsening.    Molly Schmidt was evaluated in Emergency Department on 06/17/2019 for the symptoms described in the history of present illness. She was evaluated in the context of the global COVID-19 pandemic, which necessitated consideration that the patient might be at risk for infection with the SARS-CoV-2 virus that causes COVID-19. Institutional protocols and algorithms that pertain to the evaluation of patients at risk for COVID-19 are in a  state of rapid change based on information released by regulatory bodies including the CDC and federal and state organizations. These policies and algorithms were followed during the patient's care in the ED.   As part of my medical decision making, I reviewed the following data within the electronic MEDICAL RECORD NUMBER Nursing notes reviewed and incorporated, Old chart reviewed, Radiograph reviewed chest x-ray is normal, Notes from prior ED visits and [place] Controlled Substance Database  ____________________________________________   FINAL CLINICAL IMPRESSION(S) / ED DIAGNOSES  Final diagnoses:  Suspected COVID-19 virus infection      NEW MEDICATIONS STARTED DURING THIS VISIT:  Discharge Medication List as of 06/17/2019  2:42 PM       Note:  This document was prepared using Dragon voice recognition software and may include unintentional dictation errors.    Faythe Ghee, PA-C 06/17/19 1611    Sharman Cheek, MD 06/18/19 Kristopher Oppenheim

## 2019-06-17 NOTE — Discharge Instructions (Signed)
Follow-up with your regular doctor if not improving in 3 days.  Return emergency department worsening.  Take over-the-counter Mucinex.  Your Covid test will result in 6 to 24 hours.  If you have MyChart will see the result immediately.  If not you will have to wait for a phone call.  Stay quarantined until you receive your test result.  If negative you may return to your normal activities.  If positive you must quarantine for an additional 10 to 14 days.

## 2019-06-17 NOTE — ED Notes (Signed)
See triage note  States she developed some body aches ,runny nose and sneezing couple of days ago  Unsure of fever but is afebrile on arrival to ED   States her sister tested positive for COVID about 3 weeks ago

## 2019-08-09 ENCOUNTER — Emergency Department
Admission: EM | Admit: 2019-08-09 | Discharge: 2019-08-09 | Disposition: A | Discharge status: Home / Self Care | Payer: Medicaid Other | Attending: Emergency Medicine | Admitting: Emergency Medicine

## 2019-08-09 ENCOUNTER — Other Ambulatory Visit: Payer: Self-pay

## 2019-08-09 ENCOUNTER — Encounter: Payer: Self-pay | Admitting: Emergency Medicine

## 2019-08-09 ENCOUNTER — Emergency Department: Payer: Medicaid Other

## 2019-08-09 DIAGNOSIS — N938 Other specified abnormal uterine and vaginal bleeding: Secondary | ICD-10-CM

## 2019-08-09 DIAGNOSIS — Z87891 Personal history of nicotine dependence: Secondary | ICD-10-CM | POA: Diagnosis not present

## 2019-08-09 DIAGNOSIS — N7011 Chronic salpingitis: Secondary | ICD-10-CM | POA: Diagnosis not present

## 2019-08-09 DIAGNOSIS — N939 Abnormal uterine and vaginal bleeding, unspecified: Secondary | ICD-10-CM | POA: Diagnosis not present

## 2019-08-09 DIAGNOSIS — N83202 Unspecified ovarian cyst, left side: Secondary | ICD-10-CM

## 2019-08-09 LAB — BASIC METABOLIC PANEL
Anion gap: 9 (ref 5–15)
BUN: 11 mg/dL (ref 6–20)
CO2: 25 mmol/L (ref 22–32)
Calcium: 9.5 mg/dL (ref 8.9–10.3)
Chloride: 104 mmol/L (ref 98–111)
Creatinine, Ser: 0.61 mg/dL (ref 0.44–1.00)
GFR calc Af Amer: >60 mL/min (ref >60)
GFR calc non Af Amer: >60 mL/min (ref >60)
Glucose, Bld: 97 mg/dL (ref 70–99)
Potassium: 3.3 mmol/L — ABNORMAL LOW (ref 3.5–5.1)
Sodium: 138 mmol/L (ref 135–145)

## 2019-08-09 LAB — CBC
HCT: 39.5 % (ref 36.0–46.0)
Hemoglobin: 13.2 g/dL (ref 12.0–15.0)
MCH: 31.1 pg (ref 26.0–34.0)
MCHC: 33.4 g/dL (ref 30.0–36.0)
MCV: 92.9 fL (ref 80.0–100.0)
Platelets: 240 10*3/uL (ref 150–400)
RBC: 4.25 MIL/uL (ref 3.87–5.11)
RDW: 12.3 % (ref 11.5–15.5)
WBC: 10.5 10*3/uL (ref 4.0–10.5)
nRBC: 0 % (ref 0.0–0.2)

## 2019-08-09 LAB — SAMPLE TO BLOOD BANK

## 2019-08-09 LAB — POCT PREGNANCY, URINE: Preg Test, Ur: NEGATIVE

## 2019-08-09 NOTE — ED Provider Notes (Signed)
[facility] Emergency Department Provider Note ____________________________________________   First MD Initiated Contact with Patient 08/09/19 2014     (approximate)  I have reviewed the triage vital signs and the nursing notes.  HISTORY  Chief Complaint Vaginal Bleeding  HPI Molly Schmidt is a 24 y.o. female here for evaluation of vaginal bleeding   Patient reports that in November she came off of her Depo birth control intentionally.  She then reports that she has had irregular cycles.  However her last menstrual cycle started at the beginning of this month and now is resumed once again and she has been bleeding up to the amount of about 3-4 pads per day for about 3 weeks now  Along with this she will experience some slight abdominal cramping and vaginal bleeding  Denies pregnancy.  She has been experiencing intermittent cramping and sometimes passage of large blood clots.  Symptoms been ongoing for about 3 weeks.  Today she felt like she better come in to get checked out make sure her blood counts have not been dropping  No fevers or chills.  No chest pain.  Takes no blood thinners.  Denies any vaginal discharge other than ongoing vaginal bleeding.    Past Medical History:  Diagnosis Date  . Environmental and seasonal allergies     Patient Active Problem List   Diagnosis Date Noted  . Labor and delivery, indication for care 07/10/2018  . Indication for care in labor and delivery, antepartum 07/08/2018  . Pregnancy 05/26/2018  . Pelvic pressure in pregnancy, antepartum, third trimester 05/03/2018    History reviewed. No pertinent surgical history.  Prior to Admission medications   Medication Sig Start Date End Date Taking? Authorizing Provider  acetaminophen (TYLENOL) 325 MG tablet Take 2 tablets (650 mg total) by mouth every 4 (four) hours as needed (for pain scale < 4  OR  temperature  >/=  100.5 F). 05/03/18   McVey, Prudencio Pair, CNM    ferrous sulfate 325 (65 FE) MG tablet Take 1 tablet (325 mg total) by mouth 2 (two) times daily with a meal. 07/12/18   McVey, Prudencio Pair, CNM  fexofenadine-pseudoephedrine (ALLEGRA-D) 60-120 MG 12 hr tablet Take 1 tablet by mouth 2 (two) times daily. 04/14/19   Joni Reining, PA-C  senna-docusate (SENOKOT-S) 8.6-50 MG tablet Take 2 tablets by mouth at bedtime as needed for mild constipation. 07/12/18   McVey, Prudencio Pair, CNM  vitamin C (ASCORBIC ACID) 250 MG tablet Take 1 tablet (250 mg total) by mouth 2 (two) times daily. With iron supplement 07/12/18   McVey, Prudencio Pair, CNM    Allergies Patient has no known allergies.  History reviewed. No pertinent family history.  Social History Social History   Tobacco Use  . Smoking status: Former Smoker    Types: Cigarettes    Quit date: 04/09/2018    Years since quitting: 1.3  . Smokeless tobacco: Never Used  Substance Use Topics  . Alcohol use: No  . Drug use: No    Review of Systems Constitutional: No fever/chills Eyes: No visual changes. ENT: No sore throat. Cardiovascular: Denies chest pain. Respiratory: Denies shortness of breath. Gastrointestinal: No abdominal pain but does report still have some lower abdominal cramping at times.   Genitourinary: Negative for dysuria. Musculoskeletal: Negative for back pain. Skin: Negative for rash. Neurological: Negative for headaches or weakness.    ____________________________________________   PHYSICAL EXAM:  VITAL SIGNS: ED Triage Vitals  Enc Vitals Group  BP 08/09/19 1853 122/89     Pulse Rate 08/09/19 1853 70     Resp 08/09/19 1853 15     Temp 08/09/19 1853 98.3 F (36.8 C)     Temp Source 08/09/19 1853 Oral     SpO2 08/09/19 1853 100 %     Weight 08/09/19 1853 125 lb (56.7 kg)     Height 08/09/19 1853 5\' 5"  (1.651 m)     Head Circumference --      Peak Flow --      Pain Score 08/09/19 1852 7     Pain Loc --      Pain Edu? --      Excl. in [place]? --    Constitutional:  Alert and oriented. Well appearing and in no acute distress. Eyes: Conjunctivae are normal. Head: Atraumatic. Nose: No congestion/rhinnorhea. Mouth/Throat: Mucous membranes are moist. Neck: No stridor.  Cardiovascular: Normal rate, regular rhythm. Grossly normal heart sounds.  Good peripheral circulation. Respiratory: Normal respiratory effort.  No retractions. Lungs CTAB. Gastrointestinal: Soft and nontender sent for some mild discomfort suprapubic region without rebound or guarding.  No pain McBurney's point.  Negative Murphy.. No distention. Musculoskeletal: No lower extremity tenderness nor edema. Neurologic:  Normal speech and language. No gross focal neurologic deficits are appreciated.  Skin:  Skin is warm, dry and intact. No rash noted. Psychiatric: Mood and affect are normal. Speech and behavior are normal.  ____________________________________________   LABS (all labs ordered are listed, but only abnormal results are displayed)  Labs Reviewed  BASIC METABOLIC PANEL - Abnormal; Notable for the following components:      Result Value   Potassium 3.3 (*)    All other components within normal limits  CBC  POC URINE PREG, ED  POCT PREGNANCY, URINE  SAMPLE TO BLOOD BANK   ____________________________________________  EKG   ____________________________________________  RADIOLOGY  US PELVIC COMPLETE W TRANSVAGINAL AND TORSION R/O  Result Date: 08/09/2019 CLINICAL DATA:  Initial evaluation for acute vaginal bleeding for 3 weeks. EXAM: TRANSABDOMINAL AND TRANSVAGINAL ULTRASOUND OF PELVIS DOPPLER ULTRASOUND OF OVARIES TECHNIQUE: Both transabdominal and transvaginal ultrasound examinations of the pelvis were performed. Transabdominal technique was performed for global imaging of the pelvis including uterus, ovaries, adnexal regions, and pelvic cul-de-sac. It was necessary to proceed with endovaginal exam following the transabdominal exam to visualize the uterus, endometrium, and  ovaries. Color and duplex Doppler ultrasound was utilized to evaluate blood flow to the ovaries. COMPARISON:  Prior ultrasound from 12/26/2016. FINDINGS: Uterus Measurements: 8.0 x 4.0 x 5.3 cm = volume: 89.0 mL. No fibroids or other mass visualized. Endometrium Thickness: 3.0 mm.  No focal abnormality visualized. Right ovary Measurements: 2.7 x 1.3 x 1.8 cm = volume: 3.2 mL. Simple anechoic cystic structure adjacent to the right ovary likely reflects a small hydrosalpinx. Left ovary Measurements: 3.9 x 4.4 x 4.7 cm = volume: 42.6 mL. 4.1 x 3.6 x 3.5 cm minimally complex cyst. Small amount of internal echogenic material which could reflect debris and/or blood products. No internal vascularity or solid component. Pulsed Doppler evaluation of both ovaries demonstrates normal low-resistance arterial and venous waveforms. Other findings No abnormal free fluid. IMPRESSION: 1. 4.1 cm minimally complex left ovarian cyst. While this is almost certainly benign, a short interval follow-up ultrasound in 6-12 weeks suggested to ensure resolution. 2. Small right hydrosalpinx. 3. No evidence for ovarian torsion or other acute abnormality. Electronically Signed   By: Jeannine Boga M.D.   On: 08/09/2019 21:27  Clinical history, ultrasound results, recommended course of further action discussed with Dr. Feliberto Gottron.  He recommends follow-up with Dr. Elesa Massed this week at their clinic, patient may call tomorrow to schedule appointment.  Currently recommends against starting OCP / birth control for abnormal bleeding at this time, rather recommends clinic visit this week to discuss options and follow-up.  Follow-up in the clinic this week discussed with the patient and she is comfortable with the plan ____________________________________________   PROCEDURES  Procedure(s) performed: None  Procedures  Critical Care performed: No  ____________________________________________   INITIAL IMPRESSION / ASSESSMENT AND  PLAN / ED COURSE  Pertinent labs & imaging results that were available during my care of the patient were reviewed by me and considered in my medical decision making (see chart for details).   Patient comes for evaluation of vaginal bleeding, reports that she had symptoms similar to a normal menstrual cycle except they have been ongoing for about 3 weeks now.  She does report stopping Depo in November.  Negative pregnancy test.  Very reassuring abdominal exam with only some mild suprapubic tenderness.  No rebound or guarding.  She describes moderate bleeding, and has been ongoing for 3 weeks.  Discussed with her and given the extent of how long she has been having bleeding she is interested in potentially looking at options to follow-up with a gynecologist for which she seen Gavin Potters in the past as well as consider starting her back on a birth control to stop her bleeding.  Symptoms most likely represent abnormal uterine bleeding.  Associate infectious symptoms.  Reassuring exam nontoxic well-appearing.  Hemoglobin normal range.      Return precautions and treatment recommendations and follow-up discussed with the patient who is agreeable with the plan.   ____________________________________________   FINAL CLINICAL IMPRESSION(S) / ED DIAGNOSES  Final diagnoses:  Dysfunctional uterine bleeding  Left ovarian cyst  Hydrosalpinx        Note:  This document was prepared using Dragon voice recognition software and may include unintentional dictation errors       Sharyn Creamer, MD 08/09/19 2156

## 2019-08-09 NOTE — ED Triage Notes (Signed)
Pt in via POV, reports ongoing vaginal bleeding x 3 weeks, waking up last night with pelvic cramping.  Denies hx of same.  Ambulatory to triage, NAD noted at this time.

## 2019-08-09 NOTE — ED Notes (Signed)
[place] and type and screen sent to the lab.   

## 2019-08-09 NOTE — Discharge Instructions (Signed)
Please follow up closely with obstetrics and gynecology (Dr. Jolyn Nap practice), call tomorrow to schedule an appointment for this week.  Return to the emergency room if your bleeding worsens, you become weak and dizzy or lightheaded, you have an episode of passing out, develop severe bleeding such as more than 1 soaked pad per hour for more than 3 straight hours, develop abdominal or pelvic pain, fevers chills or other new concerns arise.

## 2020-01-26 ENCOUNTER — Ambulatory Visit: Payer: Medicaid Other

## 2020-05-23 IMAGING — DX DG CHEST 1V PORT
1 series · 1 of 1 positions shown · non-contrast
Comparison: Portable exam 9552 hours compared to 04/14/2019

CLINICAL DATA: Cough, suspected [id number]

EXAM:
PORTABLE CHEST 1 VIEW

[chest ap]
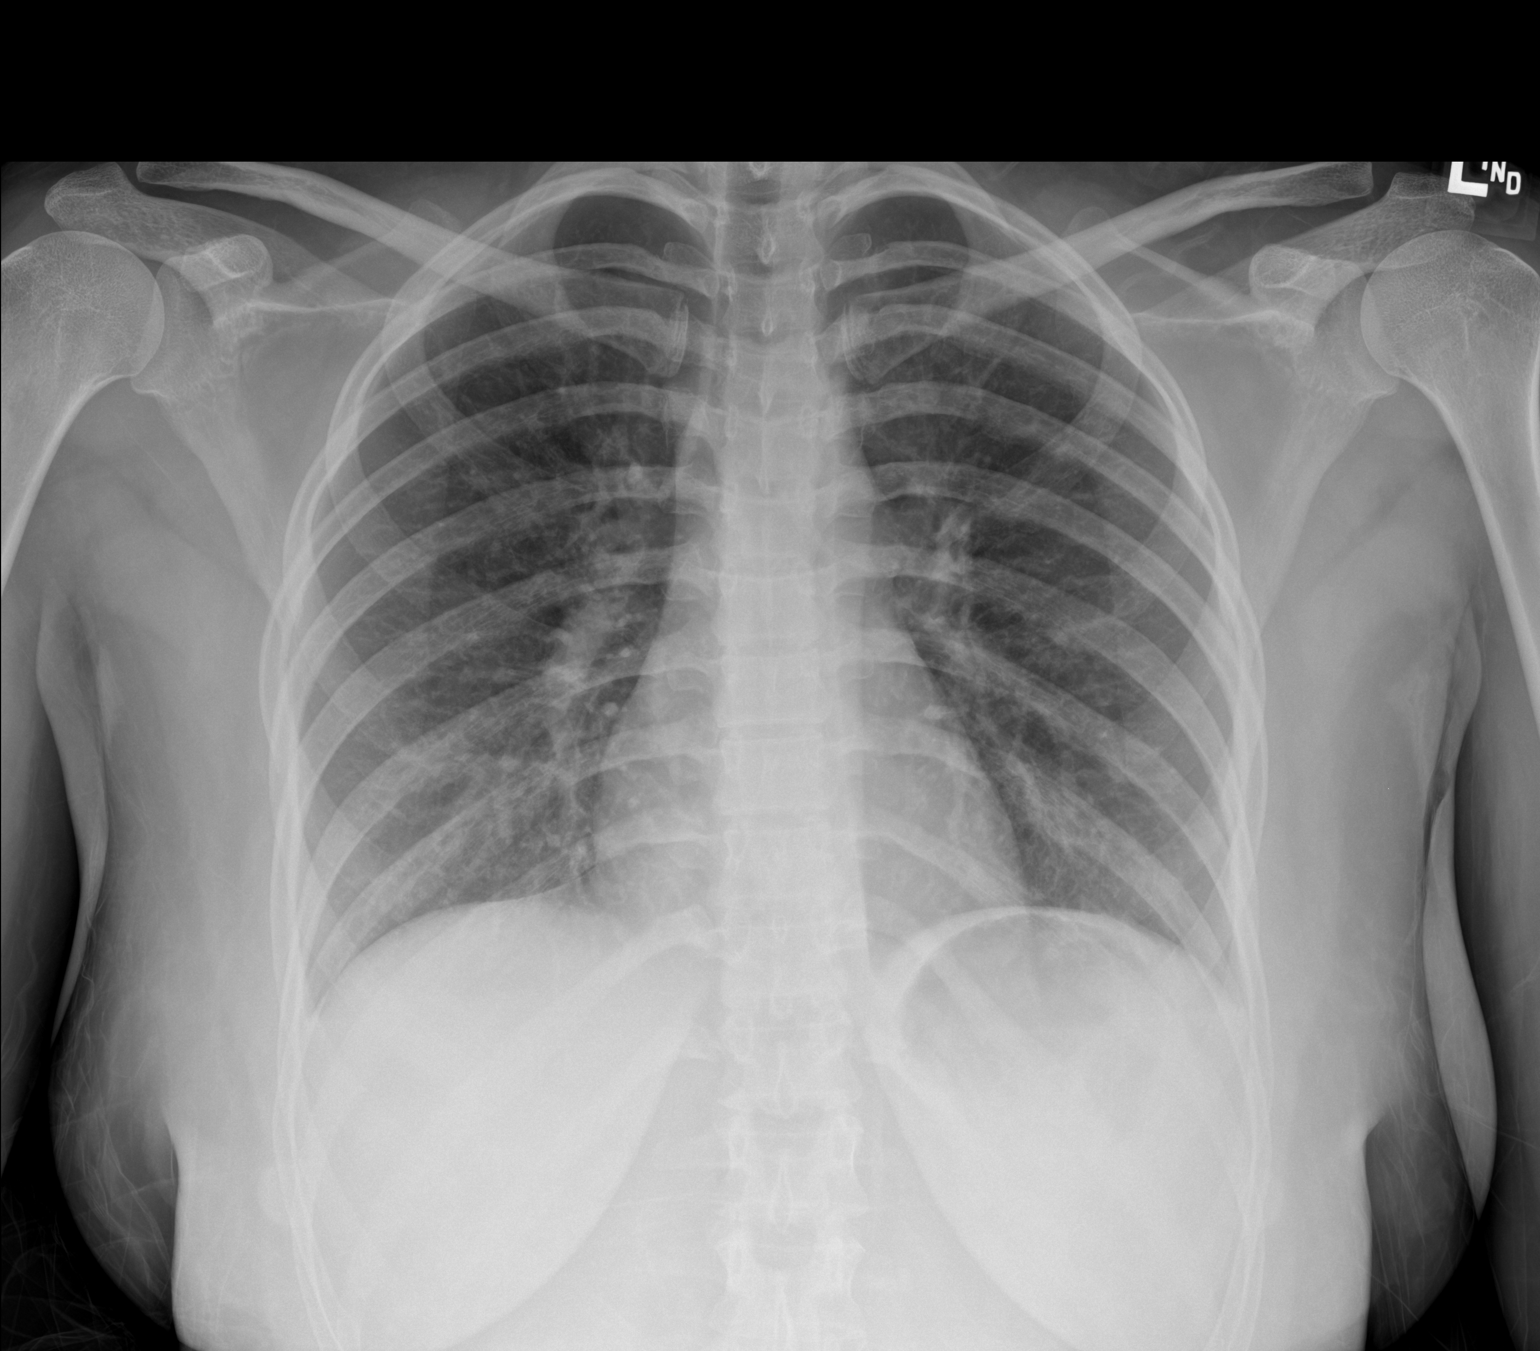

[1 of 1 positions shown; findings below may reference images not displayed]

FINDINGS: Normal heart size, mediastinal contours, and pulmonary vascularity.

Lungs clear.

No pulmonary infiltrate, pleural effusion or pneumothorax.

Appearance of lungs unchanged since previous exam.

Osseous structures unremarkable.
IMPRESSION: No acute abnormalities.

## 2020-07-15 IMAGING — US US PELVIS COMPLETE TRANSABD/TRANSVAG W DUPLEX
1 series · 13 of 25 positions shown · non-contrast
Comparison: Prior ultrasound from 12/26/2016.

CLINICAL DATA: Initial evaluation for acute vaginal bleeding for 3
weeks.

EXAM:
TRANSABDOMINAL AND TRANSVAGINAL ULTRASOUND OF PELVIS
DOPPLER ULTRASOUND OF OVARIES
TECHNIQUE: Both transabdominal and transvaginal ultrasound examinations of the
pelvis were performed. Transabdominal technique was performed for
global imaging of the pelvis including uterus, ovaries, adnexal
regions, and pelvic cul-de-sac.
It was necessary to proceed with endovaginal exam following the
transabdominal exam to visualize the uterus, endometrium, and
ovaries. Color and duplex Doppler ultrasound was utilized to
evaluate blood flow to the ovaries.

[Series 1: us pelvic complete w transvaginal and torsion righ · 13 of 110 slices shown]
[im 1/110]
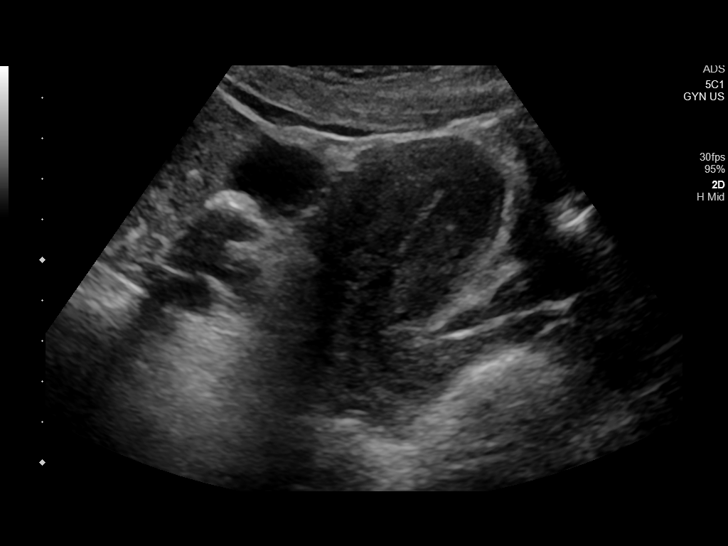
[im 10/110]
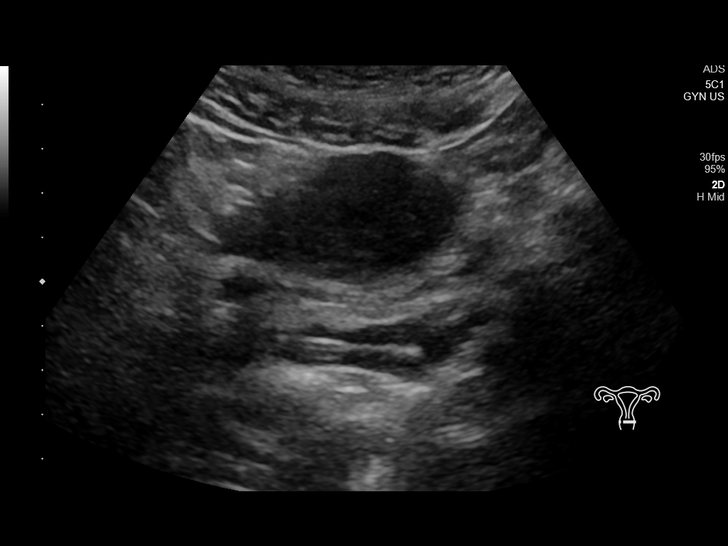
[im 19/110]
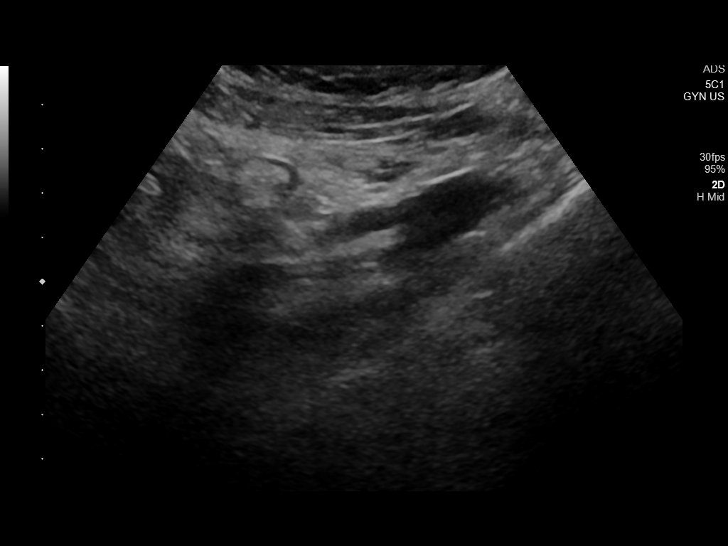
[im 28/110]
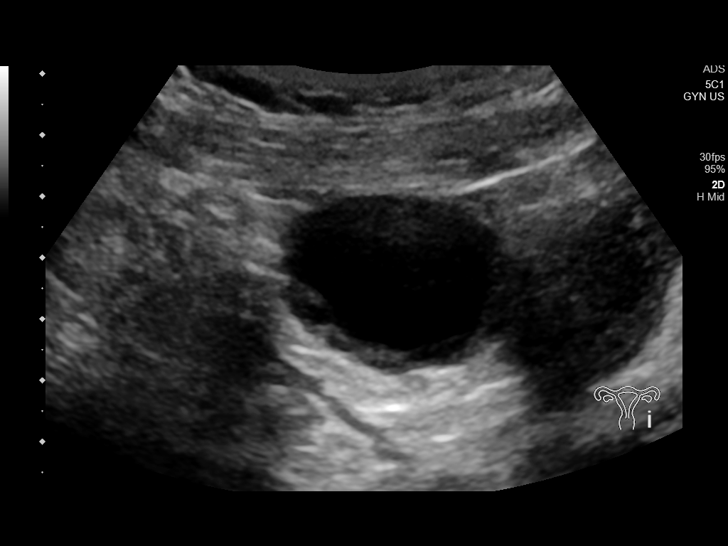
[im 37/110]
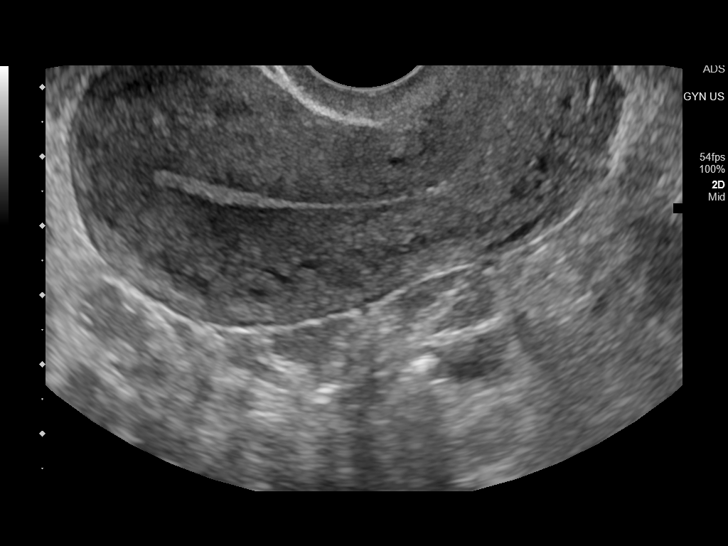
[im 46/110]
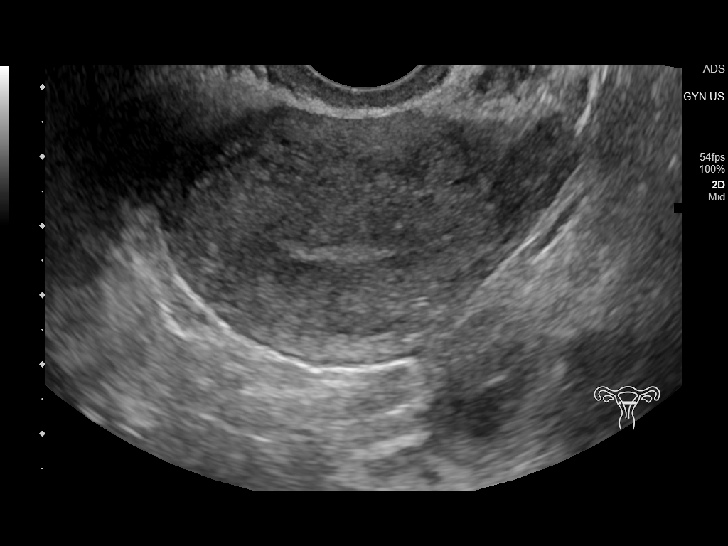
[im 55/110]
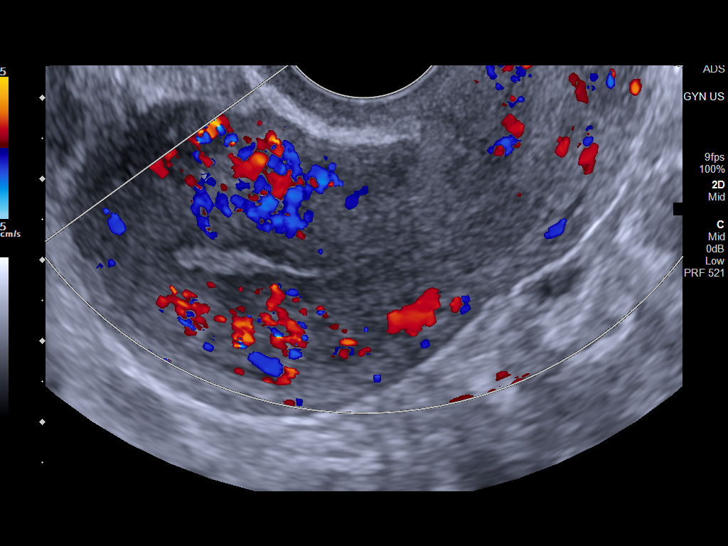
[im 64/110]
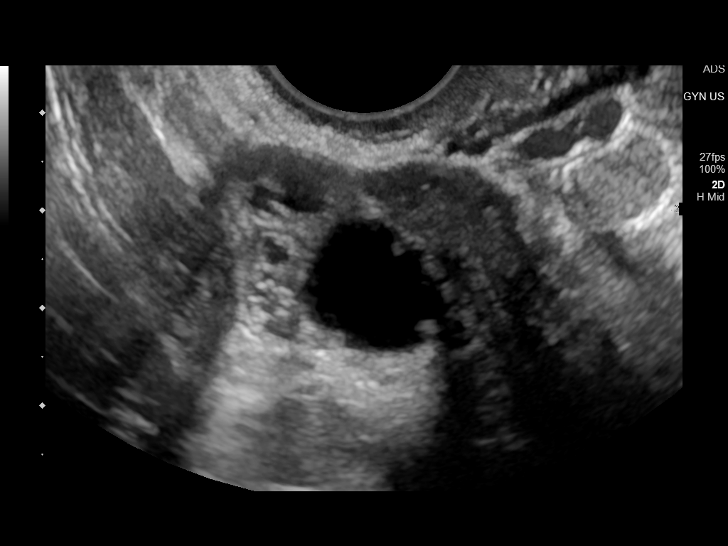
[im 73/110]
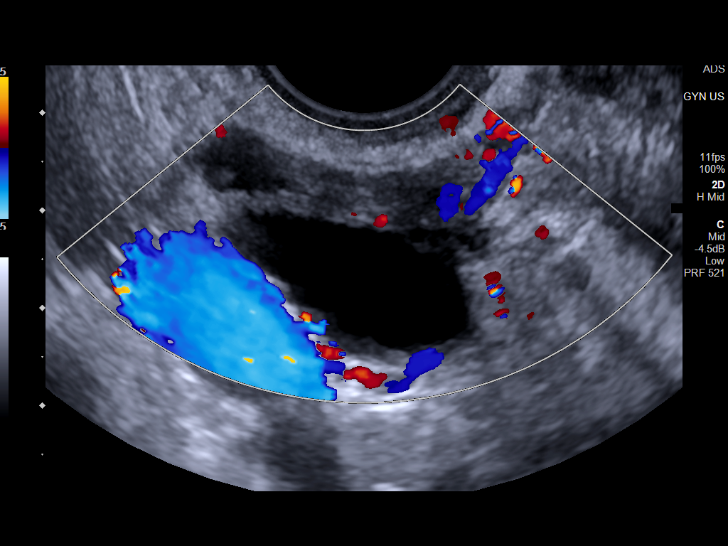
[im 82/110]
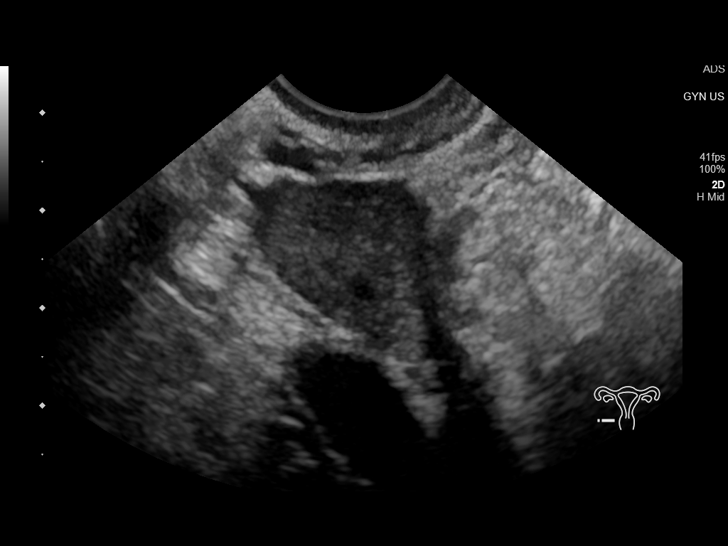
[im 91/110]
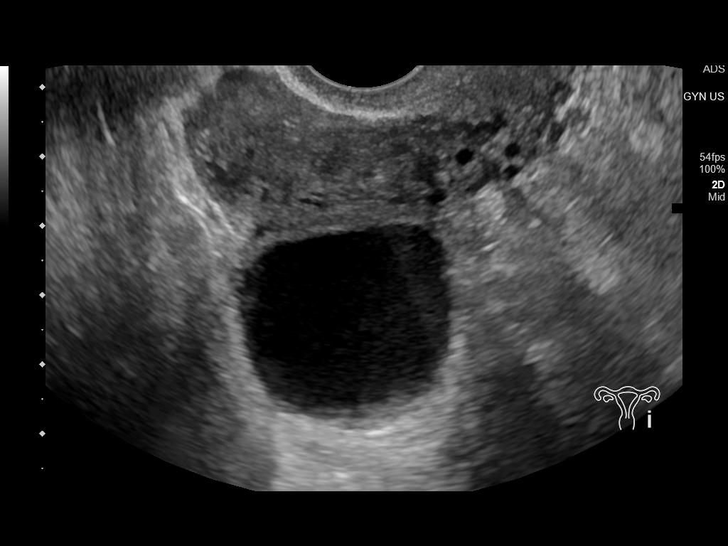
[im 100/110]
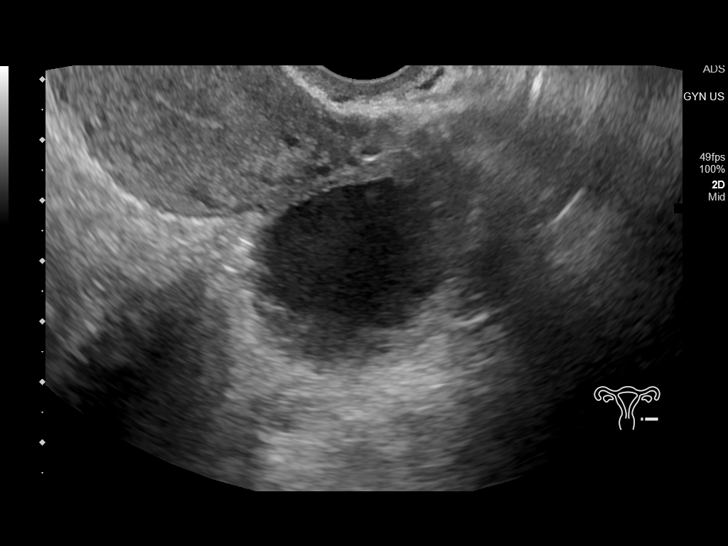
[im 110/110]
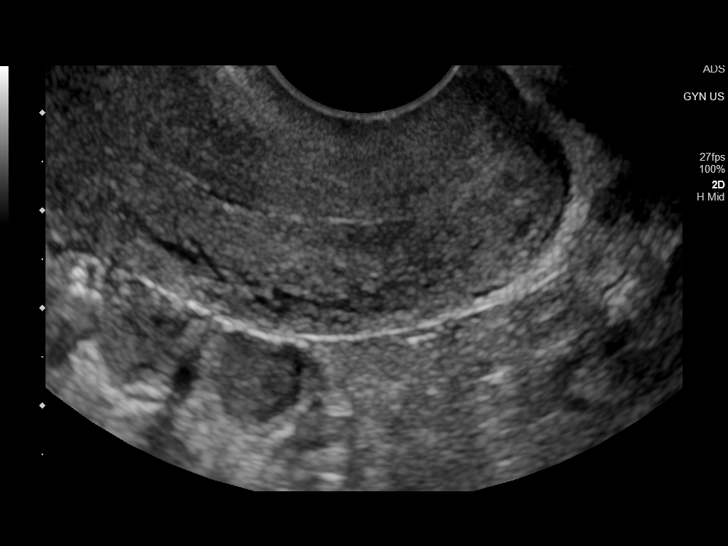

[13 of 25 positions shown; findings below may reference images not displayed]

FINDINGS: Uterus

Measurements: 8.0 x 4.0 x 5.3 cm = volume: 89.0 mL. No fibroids or
other mass visualized.

Endometrium

Thickness: 3.0 mm.  No focal abnormality visualized.

Right ovary

Measurements: 2.7 x 1.3 x 1.8 cm = volume: 3.2 mL. Simple anechoic
cystic structure adjacent to the right ovary likely reflects a small
hydrosalpinx.

Left ovary

Measurements: 3.9 x 4.4 x 4.7 cm = volume: 42.6 mL. 4.1 x 3.6 x
cm minimally complex cyst. Small amount of internal echogenic
material which could reflect debris and/or blood products. No
internal vascularity or solid component.

Pulsed Doppler evaluation of both ovaries demonstrates normal
low-resistance arterial and venous waveforms.

Other findings

No abnormal free fluid.
IMPRESSION: 1. 4.1 cm minimally complex left ovarian cyst. While this is almost
certainly benign, a short interval follow-up ultrasound in 6-12
weeks suggested to ensure resolution.
2. Small right hydrosalpinx.
3. No evidence for ovarian torsion or other acute abnormality.

## 2020-09-04 ENCOUNTER — Ambulatory Visit (LOCAL_COMMUNITY_HEALTH_CENTER): Payer: Self-pay

## 2020-09-04 ENCOUNTER — Other Ambulatory Visit: Payer: Self-pay

## 2020-09-04 DIAGNOSIS — Z111 Encounter for screening for respiratory tuberculosis: Secondary | ICD-10-CM

## 2020-09-07 ENCOUNTER — Ambulatory Visit (LOCAL_COMMUNITY_HEALTH_CENTER): Payer: Medicaid Other

## 2020-09-07 ENCOUNTER — Other Ambulatory Visit: Payer: Self-pay

## 2020-09-07 DIAGNOSIS — Z111 Encounter for screening for respiratory tuberculosis: Secondary | ICD-10-CM

## 2020-09-07 LAB — TB SKIN TEST
Induration: 0 mm
TB Skin Test: NEGATIVE

## 2021-03-29 ENCOUNTER — Other Ambulatory Visit: Payer: Self-pay

## 2021-03-29 ENCOUNTER — Ambulatory Visit: Payer: Medicaid Other | Admitting: Family Medicine

## 2021-03-29 ENCOUNTER — Encounter: Payer: Self-pay | Admitting: Family Medicine

## 2021-03-29 DIAGNOSIS — B3731 Acute candidiasis of vulva and vagina: Secondary | ICD-10-CM

## 2021-03-29 DIAGNOSIS — Z113 Encounter for screening for infections with a predominantly sexual mode of transmission: Secondary | ICD-10-CM | POA: Diagnosis not present

## 2021-03-29 LAB — WET PREP FOR TRICH, YEAST, CLUE: Trichomonas Exam: NEGATIVE

## 2021-03-29 MED ORDER — CLOTRIMAZOLE 1 % VA CREA: 1.0000 | TOPICAL_CREAM | Freq: Every day | VAGINAL | 0 refills | Status: AC

## 2021-03-29 NOTE — Progress Notes (Signed)
[facility] Department STI clinic/screening visit  Subjective:  Molly Schmidt is a 25 y.o. female being seen today for an STI screening visit. The patient reports they do have symptoms.  Patient reports that they do not desire a pregnancy in the next year.   They reported they are not interested in discussing contraception today.  Patient's last menstrual period was 03/15/2021.   Patient has the following medical conditions:   Patient Active Problem List   Diagnosis Date Noted   Labor and delivery, indication for care 07/10/2018   Indication for care in labor and delivery, antepartum 07/08/2018   Pregnancy 05/26/2018   Pelvic pressure in pregnancy, antepartum, third trimester 05/03/2018    Chief Complaint  Patient presents with   SEXUALLY TRANSMITTED DISEASE    screening    HPI  Patient reports here for screening, has s/sx   Last HIV test per patient was with pregnancy in 2019 Patient reports last pap was 2019.   See flowsheet for further details and programmatic requirements.    The following portions of the patient's history were reviewed and updated as appropriate: allergies, current medications, past medical history, past social history, past surgical history and problem list.  Objective:  There were no vitals filed for this visit.  Physical Exam Vitals and nursing note reviewed.  Constitutional:      Appearance: Normal appearance.  HENT:     Head: Normocephalic and atraumatic.     Mouth/Throat:     Mouth: Mucous membranes are moist.     Pharynx: Oropharynx is clear. No oropharyngeal exudate or posterior oropharyngeal erythema.  Pulmonary:     Effort: Pulmonary effort is normal.  Abdominal:     General: Abdomen is flat.     Palpations: Abdomen is soft. There is no mass.     Tenderness: There is no abdominal tenderness. There is no rebound.  Genitourinary:    General: Normal vulva.     Exam position: Lithotomy position.     Pubic Area: No rash  or pubic lice.      Labia:        Right: No rash or lesion.        Left: No rash or lesion.      Vagina: Normal. No vaginal discharge, erythema, bleeding or lesions.     Cervix: No cervical motion tenderness, discharge, friability, lesion or erythema.     Uterus: Normal.      Adnexa: Right adnexa normal and left adnexa normal.     Rectum: Normal.     Comments: External genitalia without, lice, nits, erythema, edema , lesions or inguinal adenopathy. Vagina with normal mucosa and thick white discharge and pH > 4.  Cervix without visual lesions, uterus firm, mobile, non-tender, no masses, CMT adnexal fullness or tenderness.   Musculoskeletal:     Cervical back: Normal range of motion and neck supple.  Lymphadenopathy:     Head:     Right side of head: No preauricular or posterior auricular adenopathy.     Left side of head: No preauricular or posterior auricular adenopathy.     Cervical: No cervical adenopathy.     Upper Body:     Right upper body: No supraclavicular or axillary adenopathy.     Left upper body: No supraclavicular or axillary adenopathy.     Lower Body: No right inguinal adenopathy. No left inguinal adenopathy.  Skin:    General: Skin is warm and dry.     Findings: No rash.  Neurological:     Mental Status: She is alert and oriented to person, place, and time.  Psychiatric:        Behavior: Behavior normal.     Assessment and Plan:  Molly Schmidt is a 25 y.o. female presenting to the [facility] Department for STI screening  1. Screening examination for venereal disease Patient accepted all screenings including wet prep, vaginal CT/GC and bloodwork for HIV/RPR.  Patient meets criteria for HepB screening? Yes. Ordered? No - declines  Patient meets criteria for HepC screening? Yes. Ordered? No - declines   Wet prep results yeast    Treatment needed for yeast  Discussed time line for [facility] results and that patient will be called with positive  results and encouraged patient to call if she had not heard in 2 weeks.  Counseled to return or seek care for continued or worsening symptoms Recommended condom use with all sex  Patient is currently using  DMPA  to prevent pregnancy.   - HIV [place] [facility] - Syphilis Serology, [place] [facility] - Chlamydia/Gonorrhea [place] [facility] - WET PREP FOR TRICH, YEAST, CLUE  2. Yeast vaginitis Yeast present on exam.  - clotrimazole (V-R CLOTRIMAZOLE VAGINAL) 1 % vaginal cream; Place 1 Applicatorful vaginally at bedtime for 7 days.  Dispense: 45 g; Refill: 0     Return for as needed.  No future appointments.  Wendi Snipes, FNP

## 2021-04-04 ENCOUNTER — Telehealth: Payer: Self-pay

## 2021-04-04 NOTE — Telephone Encounter (Signed)
Spoke to pt advised of positive test results for chlamydia from 03/29/21. Advised recommend treatment. Appt set up for 11/28 at 820am. Instructed to eat prior to appt.

## 2021-04-09 ENCOUNTER — Other Ambulatory Visit: Payer: Self-pay

## 2021-04-09 ENCOUNTER — Ambulatory Visit: Payer: Self-pay

## 2021-04-09 DIAGNOSIS — A749 Chlamydial infection, unspecified: Secondary | ICD-10-CM

## 2021-04-09 MED ORDER — AZITHROMYCIN 500 MG PO TABS
1000.0000 mg | ORAL_TABLET | Freq: Once | ORAL | Status: AC
  Administered 2021-04-09: 09:00:00 1000 mg via ORAL

## 2021-04-09 NOTE — Progress Notes (Signed)
In nurse clinic for chlamydia tx. Reports having depo at [facility], but cannot remember last dose. Has appt for depo this week. Last sex, approx 03/29/21. Treated today with Azithromycin 1 gram by mouth DOT per [facility] Dr Ralene Bathe. Advised to contact [facility] if vomits within 2 hrs of taking med. Pt reports understanding and questions answered. Jerel Shepherd, RN

## 2021-08-10 ENCOUNTER — Ambulatory Visit: Payer: Medicaid Other

## 2021-10-15 ENCOUNTER — Telehealth: Payer: Self-pay

## 2021-10-15 NOTE — Telephone Encounter (Addendum)
[employer] [facility] referring for DUB. Called and left voicemail for patient to call back to be scheduled. Sch w [facility] or Logan Bores

## 2021-10-15 NOTE — Telephone Encounter (Signed)
Patient is scheduled for 10/18/21 with Logan Bores

## 2021-10-18 ENCOUNTER — Ambulatory Visit (INDEPENDENT_AMBULATORY_CARE_PROVIDER_SITE_OTHER): Payer: Medicaid Other | Admitting: Obstetrics and Gynecology

## 2021-10-18 ENCOUNTER — Encounter: Payer: Self-pay | Admitting: Obstetrics and Gynecology

## 2021-10-18 ENCOUNTER — Other Ambulatory Visit: Payer: Self-pay | Admitting: Nurse Practitioner

## 2021-10-18 VITALS — BP 118/72 | Ht 65.0 in | Wt 119.2 lb

## 2021-10-18 DIAGNOSIS — N938 Other specified abnormal uterine and vaginal bleeding: Secondary | ICD-10-CM

## 2021-10-18 NOTE — Progress Notes (Signed)
Patient presents today for abnormal vaginal bleeding. She states a history of irregular bleeding since she was younger, lengthy periods of time with and without birth control . Patient currently is on depo, latest injection was last week. She states heavy bleeding from March to the beginning of June that was daily, also passed many clots. Patient states questions regarding possible fibroids. No other concerns at this time.

## 2021-10-18 NOTE — Progress Notes (Signed)
HPI:      [name] is a 26 y.o. G1P1001 who LMP was No LMP recorded (lmp unknown). Patient has had an injection.  Subjective:   She presents today stating that she often has breakthrough bleeding on Depo.  She just received her last Depo injection 1 week ago and her bleeding has resolved.  She likes being on Depo for birth control and reports that it often makes her amenorrheic for 6 months at a time but then she bleeds for 6 months at a time. She has previously tried Nexplanon without success.  She had irregular heavy bleeding.     Hx: The following portions of the patient's history were reviewed and updated as appropriate:             She  has a past medical history of Environmental and seasonal allergies. She does not have any pertinent problems on file. She  has no past surgical history on file. Her family history is not on file. She  reports that she quit smoking about 3 years ago. Her smoking use included cigarettes. She has never used smokeless tobacco. She reports current alcohol use. She reports current drug use. Drug: Marijuana. She has a current medication list which includes the following prescription(s): acetaminophen and vitamin c. She is allergic to other.       Review of Systems:  Review of Systems  Constitutional: Denied constitutional symptoms, night sweats, recent illness, fatigue, fever, insomnia and weight loss.  Eyes: Denied eye symptoms, eye pain, photophobia, vision change and visual disturbance.  Ears/Nose/Throat/Neck: Denied ear, nose, throat or neck symptoms, hearing loss, nasal discharge, sinus congestion and sore throat.  Cardiovascular: Denied cardiovascular symptoms, arrhythmia, chest pain/pressure, edema, exercise intolerance, orthopnea and palpitations.  Respiratory: Denied pulmonary symptoms, asthma, pleuritic pain, productive sputum, cough, dyspnea and wheezing.  Gastrointestinal: Denied, gastro-esophageal reflux, melena, nausea and vomiting.   Genitourinary: See HPI for additional information.  Musculoskeletal: Denied musculoskeletal symptoms, stiffness, swelling, muscle weakness and myalgia.  Dermatologic: Denied dermatology symptoms, rash and scar.  Neurologic: Denied neurology symptoms, dizziness, headache, neck pain and syncope.  Psychiatric: Denied psychiatric symptoms, anxiety and depression.  Endocrine: Denied endocrine symptoms including hot flashes and night sweats.   Meds:   Current Outpatient Medications on File Prior to Visit  Medication Sig Dispense Refill   acetaminophen (TYLENOL) 325 MG tablet Take 2 tablets (650 mg total) by mouth every 4 (four) hours as needed (for pain scale < 4  OR  temperature  >/=  100.5 F).     vitamin C (ASCORBIC ACID) 250 MG tablet Take 1 tablet (250 mg total) by mouth 2 (two) times daily. With iron supplement (Patient not taking: Reported on 10/18/2021) 180 tablet 0   No current facility-administered medications on file prior to visit.      Objective:     Vitals:   10/18/21 1458  BP: 118/72   Filed Weights   10/18/21 1458  Weight: 119 lb 3.2 oz (54.1 kg)                        Assessment:    G1P1001 Patient Active Problem List   Diagnosis Date Noted   Labor and delivery, indication for care 07/10/2018   Indication for care in labor and delivery, antepartum 07/08/2018   Pregnancy 05/26/2018   Pelvic pressure in pregnancy, antepartum, third trimester 05/03/2018     1. Uterine bleeding, dysfunctional     Patient has irregular bleeding  on Depo but currently happy on Depo.  She would like to continue Depo and her bleeding becomes irregular.  At that time she would consider other forms of birth control/cycle control especially IUD.   Plan:            1.  Patient like to consider other forms of birth control only if she has breakthrough bleeding on Depo.  She will inform us if this starts to happen. She has expectations that this will occur in the fall. She would  also like to get a Pap smear at her next visit. Orders No orders of the defined types were placed in this encounter.   No orders of the defined types were placed in this encounter.     F/U  Return in about 3 months (around 01/18/2022). I spent 32 minutes involved in the care of this patient preparing to see the patient by obtaining and reviewing her medical history (including labs, imaging tests and prior procedures), documenting clinical information in the electronic health record (EHR), counseling and coordinating care plans, writing and sending prescriptions, ordering tests or procedures and in direct communicating with the patient and medical staff discussing pertinent items from her history and physical exam.  Elonda Husky, M.D. 10/18/2021 3:33 PM

## 2021-10-24 ENCOUNTER — Ambulatory Visit: Payer: Medicaid Other | Attending: Nurse Practitioner

## 2022-02-27 ENCOUNTER — Telehealth: Payer: Self-pay

## 2022-02-27 NOTE — Telephone Encounter (Signed)
Patient contacted office to report that she has been experiencing break through bleeding for the past three months since continuing on Depo Provera, patient was last seen in office by Dr. Ellard Artis on 10/18/21 and advised to RTC in 3 months if bleeding continued. Patient was transferred to scheduler on phone to schedule follow up with Dr. Amalia Hailey. KW

## 2022-03-12 ENCOUNTER — Ambulatory Visit: Payer: Medicaid Other | Admitting: Obstetrics and Gynecology

## 2022-03-12 DIAGNOSIS — N921 Excessive and frequent menstruation with irregular cycle: Secondary | ICD-10-CM

## 2022-05-16 ENCOUNTER — Ambulatory Visit: Payer: Medicaid Other | Admitting: Obstetrics & Gynecology

## 2022-05-16 ENCOUNTER — Telehealth: Payer: Self-pay | Admitting: Obstetrics & Gynecology

## 2022-05-16 NOTE — Telephone Encounter (Signed)
Reached out to pt to reschedule colpo that was scheduled for 05/16/2022 at 9:55 with Dr. Hulan Fray.  Was able to reschedule the appt with Dr. Hulan Fray on 06/10/22 at 1:55.

## 2022-06-10 ENCOUNTER — Ambulatory Visit: Payer: Medicaid Other | Admitting: Obstetrics & Gynecology

## 2022-06-10 ENCOUNTER — Telehealth: Payer: Self-pay | Admitting: Obstetrics & Gynecology

## 2022-06-10 NOTE — Telephone Encounter (Signed)
Reached out to pt to reschedule colpo that was scheduled on 06/10/2022 at 1:55 with Dr. Hulan Fray.  Was able to reach pt and reschedule for 06/24/2022 at 1:35.

## 2022-06-24 ENCOUNTER — Ambulatory Visit: Payer: Medicaid Other | Admitting: Obstetrics & Gynecology

## 2022-06-24 ENCOUNTER — Telehealth: Payer: Self-pay | Admitting: Obstetrics & Gynecology

## 2022-06-24 NOTE — Telephone Encounter (Signed)
Reached out to pt to reschedule colpo appt that was scheduled for 06/24/2022 at 1:35. With Dr. Hulan Fray.  Left message for pt to call back.

## 2022-06-25 NOTE — Telephone Encounter (Signed)
Pt was rescheduled with Dr. Hulan Fray on 07/19/2022 at 1:35.

## 2022-07-19 ENCOUNTER — Ambulatory Visit (INDEPENDENT_AMBULATORY_CARE_PROVIDER_SITE_OTHER): Payer: Medicaid Other | Admitting: Obstetrics & Gynecology

## 2022-07-19 ENCOUNTER — Encounter: Payer: Self-pay | Admitting: Obstetrics & Gynecology

## 2022-07-19 ENCOUNTER — Other Ambulatory Visit (HOSPITAL_COMMUNITY): Admission: RE | Admit: 2022-07-19 | Payer: Medicaid Other | Source: Home / Self Care

## 2022-07-19 ENCOUNTER — Other Ambulatory Visit (HOSPITAL_COMMUNITY)
Admission: RE | Admit: 2022-07-19 | Discharge: 2022-07-19 | Disposition: A | Discharge status: Home / Self Care | Payer: Medicaid Other | Source: Ambulatory Visit | Attending: Obstetrics & Gynecology | Admitting: Obstetrics & Gynecology

## 2022-07-19 VITALS — BP 120/80 | Ht 64.0 in | Wt 125.0 lb

## 2022-07-19 DIAGNOSIS — R87612 Low grade squamous intraepithelial lesion on cytologic smear of cervix (LGSIL): Secondary | ICD-10-CM | POA: Insufficient documentation

## 2022-07-19 NOTE — Addendum Note (Signed)
Addended by: Quintella Baton D on: 07/19/2022 02:02 PM   Modules accepted: Orders

## 2022-07-19 NOTE — Progress Notes (Addendum)
    GYNECOLOGY PROGRESS NOTE  Subjective:    Patient ID: Molly Schmidt, female    DOB: August 26, 1995, 27 y.o.   MRN: [id number]  HPI  Patient is a 27 y.o. single [id number] (35 yo daughter)  who presents for a colpo due to a LGSIL pap at [facility] 03/2022. She now uses condoms for contraception.  The following portions of the patient's history were reviewed and updated as appropriate: allergies, current medications, past family history, past medical history, past social history, past surgical history, and problem list.  Review of Systems Pertinent items are noted in HPI.  She had Gardasil in 2011.  Objective:   Blood pressure 120/80, height 5\' 4"  (1.626 m), weight 125 lb (56.7 kg). Body mass index is 21.46 kg/m. Well nourished, well hydrated Black female, no apparent distress She is ambulating and conversing normally. UPT negative, consent signed, time out done Cervix prepped with acetic acid. Transformation zone seen in its entirety. Colpo adequate. Colpo findings all normal. ECC obtained. She tolerated the procedure well.   Assessment:   1. LGSIL on Pap smear of cervix      Plan:   1. LGSIL on Pap smear of cervix - await pathology. She understands that if the ECC is abnormal, then she will need a LEEP. If normal, then pap smear in a year. - Colposcopy  I have advised her to stop vaping tobacco due to risk of cervical cancer,

## 2022-07-23 ENCOUNTER — Encounter: Payer: Self-pay | Admitting: Obstetrics & Gynecology

## 2022-07-23 LAB — SURGICAL PATHOLOGY

## 2022-07-24 ENCOUNTER — Encounter: Payer: Self-pay | Admitting: Obstetrics & Gynecology

## 2022-11-15 ENCOUNTER — Emergency Department
Admission: EM | Admit: 2022-11-15 | Discharge: 2022-11-15 | Disposition: A | Discharge status: Home / Self Care | Payer: Medicaid Other | Attending: Emergency Medicine | Admitting: Emergency Medicine

## 2022-11-15 ENCOUNTER — Other Ambulatory Visit: Payer: Self-pay

## 2022-11-15 DIAGNOSIS — K649 Unspecified hemorrhoids: Secondary | ICD-10-CM | POA: Insufficient documentation

## 2022-11-15 DIAGNOSIS — K625 Hemorrhage of anus and rectum: Secondary | ICD-10-CM | POA: Diagnosis present

## 2022-11-15 NOTE — Discharge Instructions (Signed)
Use preparation H to the affected area Soak in tub of warm water with epsom salts Follow up with surgery if you would like to have the hemorrhoids removed, it is not unusual for them to bleed occasionally

## 2022-11-15 NOTE — ED Provider Notes (Signed)
[facility] Provider Note    Event Date/Time   First MD Initiated Contact with Patient 11/15/22 2047     (approximate)   History   Laceration (To perineal area)   HPI  Molly Schmidt is a 27 y.o. female with no significant past medical history presents emergency department stating that she noticed bleeding from her private area earlier today.  States that she had a large tear during childbirth 4 years ago but she had to be sent back.  States she has had some loose skin in that area since delivering her child.  Noticed today after bowel movement that she had bleeding.  States area is not painful but she thought she saw white meat.  Patient states she is not on her menstrual cycle.  But has noticed bleeding.      Physical Exam   Triage Vital Signs: ED Triage Vitals  Enc Vitals Group     BP 11/15/22 2038 120/83     Pulse Rate 11/15/22 2038 89     Resp 11/15/22 2038 16     Temp 11/15/22 2038 98.3 F (36.8 C)     Temp Source 11/15/22 2038 Oral     SpO2 11/15/22 2038 100 %     Weight 11/15/22 2039 125 lb (56.7 kg)     Height 11/15/22 2039 5\' 4"  (1.626 m)     Head Circumference --      Peak Flow --      Pain Score 11/15/22 2039 0     Pain Loc --      Pain Edu? --      Excl. in GC? --     Most recent vital signs: Vitals:   11/15/22 2038 11/15/22 2136  BP: 120/83   Pulse: 89   Resp: 16 16  Temp: 98.3 F (36.8 C)   SpO2: 100% 99%     General: Awake, no distress.   CV:  Good peripheral perfusion. regular rate and  rhythm Resp:  Normal effort.  Abd:  No distention.   Other:  Exam of the outer vagina and rectum shows several large hemorrhoids that are nonthrombosed.  One of the hemorrhoids appears to have a slight abrasion noted on it.  There is no scarring or open tissue at the perineum or vagina.   ED Results / Procedures / Treatments   Labs (all labs ordered are listed, but only abnormal results are displayed) Labs Reviewed - No data to  display   EKG     RADIOLOGY     PROCEDURES:   Procedures   MEDICATIONS ORDERED IN ED: Medications - No data to display   IMPRESSION / MDM / ASSESSMENT AND PLAN / ED COURSE  I reviewed the triage vital signs and the nursing notes.                              Differential diagnosis includes, but is not limited to, vaginal laceration, fissure, hemorrhoid  Patient's presentation is most consistent with acute illness / injury with system symptoms.   Physical exam is most consistent with hemorrhoids.  Pt was given reassurance.  Instructed to f/u with surgery, use prep H and sitz baths.  She is in agreement with treatment plan.  Discharged in stable condition      FINAL CLINICAL IMPRESSION(S) / ED DIAGNOSES   Final diagnoses:  Hemorrhoids, unspecified hemorrhoid type     Rx / DC Orders  ED Discharge Orders     None        Note:  This document was prepared using Dragon voice recognition software and may include unintentional dictation errors.    Faythe Ghee, PA-C 11/15/22 2303    Dionne Bucy, MD 11/16/22 Rich Fuchs

## 2022-11-15 NOTE — ED Triage Notes (Signed)
Pt to ed from home via POV for bleeding from her perineal area where she tore during child birth 4 years ago. Pt said she assessed the area and is sure its not a hemorrhoid. Pt states is started bleeding today but was feeling "torn" last week. Pt is caox4, in no acute distress and ambulatory in triage.

## 2023-04-14 ENCOUNTER — Encounter: Payer: Self-pay | Admitting: Advanced Practice Midwife

## 2023-04-14 ENCOUNTER — Ambulatory Visit (INDEPENDENT_AMBULATORY_CARE_PROVIDER_SITE_OTHER): Payer: Medicaid Other | Admitting: Advanced Practice Midwife

## 2023-04-14 VITALS — BP 109/62 | HR 76 | Wt 132.1 lb

## 2023-04-14 DIAGNOSIS — R102 Pelvic and perineal pain: Secondary | ICD-10-CM | POA: Diagnosis not present

## 2023-04-14 NOTE — Progress Notes (Signed)
Patient ID: Molly Schmidt, female   DOB: 08-28-1995, 27 y.o.   MRN: [id number]  Reason for Visit: Pelvic Pain (Pelvic pain for about 3 weeks now)   Subjective:  HPI:  Molly Schmidt is a 27 y.o. female being seen for concerns of ongoing pelvic pain. She reports the pain originates with sexual activity and lasts for a few days after. Accompanying symptoms are nausea, vomiting, lightheaded. She has monthly periods lasting 5-6 days with heavy flow some days. She denies severe menstrual pain. She had recent normal STD testing. She stopped using Depo for birth control last year since she had daily bleeding while on it.   We discussed the possible diagnoses; PID, endometriosis, pain with deeper penetration- collision dyspareunia. The most likely given her history is the latter and she agrees with the assessment. She agrees to have a gyn ultrasound. Last one was 3 years ago and mentioned a 4 cm left ovarian cyst. Will follow up after ultrasound.  Past Medical History:  Diagnosis Date   Environmental and seasonal allergies    History reviewed. No pertinent family history. History reviewed. No pertinent surgical history.  Short Social History:  Social History   Tobacco Use   Smoking status: Former    Current packs/day: 0.00    Types: Cigarettes    Quit date: 04/09/2018    Years since quitting: 5.0   Smokeless tobacco: Never  Substance Use Topics   Alcohol use: Yes    Comment: weekends    Allergies  Allergen Reactions   Other     Cats, Seasonal    Current Outpatient Medications  Medication Sig Dispense Refill   ANUSOL-HC 2.5 % rectal cream Apply topically.     acetaminophen (TYLENOL) 325 MG tablet Take 2 tablets (650 mg total) by mouth every 4 (four) hours as needed (for pain scale < 4  OR  temperature  >/=  100.5 F). (Patient not taking: Reported on 07/19/2022)     vitamin C (ASCORBIC ACID) 250 MG tablet Take 1 tablet (250 mg total) by mouth 2 (two) times daily. With iron  supplement (Patient not taking: Reported on 10/18/2021) 180 tablet 0   No current facility-administered medications for this visit.    Review of Systems  Constitutional:  Negative for chills and fever.  HENT:  Negative for congestion, ear discharge, ear pain, hearing loss, sinus pain and sore throat.   Eyes:  Negative for blurred vision and double vision.  Respiratory:  Negative for cough, shortness of breath and wheezing.   Cardiovascular:  Negative for chest pain, palpitations and leg swelling.  Gastrointestinal:  Negative for abdominal pain, blood in stool, constipation, diarrhea, heartburn, melena, nausea and vomiting.  Genitourinary:  Negative for dysuria, flank pain, frequency, hematuria and urgency.       Positive for pelvic pain  Musculoskeletal:  Negative for back pain, joint pain and myalgias.  Skin:  Negative for itching and rash.  Neurological:  Negative for dizziness, tingling, tremors, sensory change, speech change, focal weakness, seizures, loss of consciousness, weakness and headaches.  Endo/Heme/Allergies:  Negative for environmental allergies. Does not bruise/bleed easily.  Psychiatric/Behavioral:  Negative for depression, hallucinations, memory loss, substance abuse and suicidal ideas. The patient is not nervous/anxious and does not have insomnia.         Objective:  Objective   Vitals:   04/14/23 1601  BP: 109/62  Pulse: 76  Weight: 132 lb 1.6 oz (59.9 kg)   Body mass index is 22.67 kg/m. Constitutional:  Well nourished, well developed female in no acute distress.  HEENT: normal Skin: Warm and dry.   Extremity:  no edema   Respiratory:  Normal respiratory effort Abdomen: soft, nontender, nondistended, no abnormal masses Psych: Alert and Oriented x3. No memory deficits. Normal mood and affect.    Assessment/Plan:     27 y.o. G1 P38 female with pelvic pain/vagal response during intercourse  Control for depth of penetration Gyn ultrasound ordered Follow  up after ultrasound results   Molly Schmidt, CNM [facility] 04/14/2023 5:11 PM

## 2023-05-02 ENCOUNTER — Telehealth: Payer: Self-pay | Admitting: Advanced Practice Midwife

## 2023-05-02 ENCOUNTER — Other Ambulatory Visit: Payer: Medicaid Other

## 2023-05-02 NOTE — Telephone Encounter (Signed)
Reached out to pt about gyn [place] that was scheduled on 05/02/23 at 11:15 at [facility].  Left message for pt to call back.

## 2023-05-05 NOTE — Telephone Encounter (Signed)
Reached out to pt (2x) about gyn [place] that was scheduled on 12/20/024 at 11:15 at [facility].  Was able to speak with pt.  Gave number for Centralized Scheduling, since we do not have availability here at [facility].

## 2023-06-02 ENCOUNTER — Encounter: Payer: Self-pay | Admitting: Advanced Practice Midwife

## 2023-06-02 ENCOUNTER — Telehealth: Payer: Self-pay | Admitting: Advanced Practice Midwife

## 2023-06-02 ENCOUNTER — Other Ambulatory Visit: Payer: Medicaid Other

## 2023-06-02 NOTE — Telephone Encounter (Signed)
Reached out to pt about gyn [place] that was scheduled on 06/02/2023 at 9:15 (per [facility]).  Gave pt the number for Centralized Scheduling so that she could get the [place] rescheduled.

## 2023-06-04 ENCOUNTER — Encounter: Payer: Self-pay | Admitting: Advanced Practice Midwife

## 2023-06-12 ENCOUNTER — Emergency Department
Admission: EM | Admit: 2023-06-12 | Discharge: 2023-06-12 | Discharge status: Against Medical Advice | Payer: Medicaid Other | Attending: Emergency Medicine | Admitting: Emergency Medicine

## 2023-06-12 ENCOUNTER — Emergency Department: Payer: Medicaid Other

## 2023-06-12 ENCOUNTER — Other Ambulatory Visit: Payer: Self-pay

## 2023-06-12 DIAGNOSIS — R059 Cough, unspecified: Secondary | ICD-10-CM | POA: Diagnosis present

## 2023-06-12 DIAGNOSIS — Z5321 Procedure and treatment not carried out due to patient leaving prior to being seen by health care provider: Secondary | ICD-10-CM | POA: Diagnosis not present

## 2023-06-12 DIAGNOSIS — Z20822 Contact with and (suspected) exposure to covid-19: Secondary | ICD-10-CM | POA: Insufficient documentation

## 2023-06-12 DIAGNOSIS — R0981 Nasal congestion: Secondary | ICD-10-CM | POA: Insufficient documentation

## 2023-06-12 LAB — RESP PANEL BY RT-PCR (RSV, FLU A&B, COVID)  RVPGX2
Influenza A by PCR: NEGATIVE
Influenza B by PCR: NEGATIVE
Resp Syncytial Virus by PCR: NEGATIVE
SARS Coronavirus 2 by RT PCR: NEGATIVE

## 2023-06-12 NOTE — ED Triage Notes (Signed)
Pt states she has had cough congestion for the past few days

## 2024-05-19 ENCOUNTER — Ambulatory Visit: Admitting: Student in an Organized Health Care Education/Training Program
